# Patient Record
Sex: Female | Born: 1980 | Race: White | Hispanic: No | Marital: Married | Smoking: Never smoker
Health system: Southern US, Community
[De-identification: ages and names within clinical notes are randomized; demographics above are authoritative.]

## PROBLEM LIST (undated history)

## (undated) DIAGNOSIS — F419 Anxiety disorder, unspecified: Secondary | ICD-10-CM

## (undated) DIAGNOSIS — M549 Dorsalgia, unspecified: Secondary | ICD-10-CM

## (undated) DIAGNOSIS — F32A Depression, unspecified: Secondary | ICD-10-CM

## (undated) DIAGNOSIS — T8859XA Other complications of anesthesia, initial encounter: Secondary | ICD-10-CM

## (undated) DIAGNOSIS — F329 Major depressive disorder, single episode, unspecified: Secondary | ICD-10-CM

## (undated) DIAGNOSIS — J45909 Unspecified asthma, uncomplicated: Secondary | ICD-10-CM

## (undated) DIAGNOSIS — R112 Nausea with vomiting, unspecified: Secondary | ICD-10-CM

## (undated) DIAGNOSIS — Z9889 Other specified postprocedural states: Secondary | ICD-10-CM

## (undated) DIAGNOSIS — T4145XA Adverse effect of unspecified anesthetic, initial encounter: Secondary | ICD-10-CM

## (undated) HISTORY — PX: WISDOM TOOTH EXTRACTION: SHX21

## (undated) HISTORY — DX: Anxiety disorder, unspecified: F41.9

---

## 2006-12-09 ENCOUNTER — Ambulatory Visit: Payer: Self-pay

## 2007-02-05 ENCOUNTER — Other Ambulatory Visit: Payer: Self-pay | Admitting: Emergency Medicine

## 2007-02-05 ENCOUNTER — Inpatient Hospital Stay (HOSPITAL_COMMUNITY): Admission: AD | Admit: 2007-02-05 | Discharge: 2007-02-05 | Payer: Self-pay | Admitting: Obstetrics and Gynecology

## 2007-06-08 ENCOUNTER — Inpatient Hospital Stay: Payer: Self-pay

## 2008-09-15 ENCOUNTER — Emergency Department (HOSPITAL_COMMUNITY): Admission: EM | Admit: 2008-09-15 | Discharge: 2008-09-15 | Payer: Self-pay | Admitting: Emergency Medicine

## 2008-12-24 ENCOUNTER — Inpatient Hospital Stay: Payer: Self-pay

## 2011-04-12 LAB — URINALYSIS, ROUTINE W REFLEX MICROSCOPIC
Bilirubin Urine: NEGATIVE
Glucose, UA: NEGATIVE
Ketones, ur: NEGATIVE
Specific Gravity, Urine: 1.018
pH: 7.5

## 2011-04-12 LAB — COMPREHENSIVE METABOLIC PANEL
ALT: 14
AST: 20
Alkaline Phosphatase: 55
Calcium: 9.1
GFR calc Af Amer: >60
Glucose, Bld: 72
Potassium: 4.1
Sodium: 134 — ABNORMAL LOW
Total Protein: 6.8

## 2011-04-12 LAB — DIFFERENTIAL
Basophils Relative: 1
Eosinophils Absolute: 0.1
Eosinophils Relative: 1
Lymphs Abs: 1.3
Monocytes Absolute: 0.6
Monocytes Relative: 7
Neutrophils Relative %: 76

## 2011-04-12 LAB — WET PREP, GENITAL
Clue Cells Wet Prep HPF POC: NONE SEEN
Trich, Wet Prep: NONE SEEN

## 2011-04-12 LAB — CBC
Hemoglobin: 10.7 — ABNORMAL LOW
RBC: 3.35 — ABNORMAL LOW
RDW: 13.2

## 2014-06-26 ENCOUNTER — Ambulatory Visit: Payer: Self-pay | Admitting: Medical

## 2014-12-17 ENCOUNTER — Encounter (HOSPITAL_COMMUNITY): Payer: Self-pay | Admitting: Emergency Medicine

## 2014-12-17 ENCOUNTER — Emergency Department (HOSPITAL_COMMUNITY)
Admission: EM | Admit: 2014-12-17 | Discharge: 2014-12-17 | Disposition: A | Discharge status: Home / Self Care | Payer: BLUE CROSS/BLUE SHIELD | Attending: Emergency Medicine | Admitting: Emergency Medicine

## 2014-12-17 DIAGNOSIS — Y92410 Unspecified street and highway as the place of occurrence of the external cause: Secondary | ICD-10-CM | POA: Diagnosis not present

## 2014-12-17 DIAGNOSIS — S299XXA Unspecified injury of thorax, initial encounter: Secondary | ICD-10-CM | POA: Diagnosis present

## 2014-12-17 DIAGNOSIS — Y9389 Activity, other specified: Secondary | ICD-10-CM | POA: Insufficient documentation

## 2014-12-17 DIAGNOSIS — Y999 Unspecified external cause status: Secondary | ICD-10-CM | POA: Diagnosis not present

## 2014-12-17 DIAGNOSIS — S4991XA Unspecified injury of right shoulder and upper arm, initial encounter: Secondary | ICD-10-CM | POA: Diagnosis not present

## 2014-12-17 DIAGNOSIS — M546 Pain in thoracic spine: Secondary | ICD-10-CM

## 2014-12-17 MED ORDER — IBUPROFEN 400 MG PO TABS
800.0000 mg | ORAL_TABLET | Freq: Once | ORAL | Status: AC
  Administered 2014-12-17: 800 mg via ORAL
  Filled 2014-12-17: qty 2

## 2014-12-17 MED ORDER — CYCLOBENZAPRINE HCL 10 MG PO TABS: 10.0000 mg | ORAL_TABLET | Freq: Three times a day (TID) | ORAL | Status: DC | PRN

## 2014-12-17 MED ORDER — IBUPROFEN 800 MG PO TABS: 800.0000 mg | ORAL_TABLET | Freq: Three times a day (TID) | ORAL | Status: DC | PRN
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
## 2014-12-25 ENCOUNTER — Ambulatory Visit
Admission: RE | Admit: 2014-12-25 | Discharge: 2014-12-25 | Disposition: A | Discharge status: Home / Self Care | Payer: No Typology Code available for payment source | Source: Ambulatory Visit | Attending: Family Medicine | Admitting: Family Medicine

## 2014-12-25 ENCOUNTER — Other Ambulatory Visit: Payer: Self-pay | Admitting: Family Medicine

## 2014-12-25 DIAGNOSIS — M542 Cervicalgia: Secondary | ICD-10-CM

## 2014-12-25 DIAGNOSIS — R52 Pain, unspecified: Secondary | ICD-10-CM

## 2014-12-25 DIAGNOSIS — M25511 Pain in right shoulder: Secondary | ICD-10-CM

## 2015-01-13 ENCOUNTER — Ambulatory Visit: Payer: BLUE CROSS/BLUE SHIELD | Attending: Medical | Admitting: Physical Therapy

## 2015-01-13 DIAGNOSIS — M545 Low back pain, unspecified: Secondary | ICD-10-CM

## 2015-01-13 DIAGNOSIS — M542 Cervicalgia: Secondary | ICD-10-CM | POA: Diagnosis present

## 2015-01-13 DIAGNOSIS — M25511 Pain in right shoulder: Secondary | ICD-10-CM | POA: Diagnosis present
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
## 2015-01-13 NOTE — Patient Instructions (Signed)
All exercises provided were adapted from [email or web address]. Patient was provided a written handout with pictures as described. Any additional cues were manually entered in to handout and copied in to this document.    BRACE HEEL SLIDES  While lying on your back with your knees bent,  slowly slide your heel foward on the floor/bed and then slide it back. Use your stomach muscles to keep your spine from moving.    Have your hand on the notch above your right hip with a tennis ball and a weight of some sort. Perform the exercise without the muscle under the tennis ball popping up.    ELASTIC BAND EXTERNAL ROTATION - BILATERAL   While holding an elastic band with both hands and elbows bent at your side, pull the band away from your stomach.   Keep your elbows at your side the entire time.    Cervical Retraction + OP  Sit upright in a supportive chair and tuck your chin backwards. Add overpressure until you get to the end of movement.

## 2015-01-15 ENCOUNTER — Encounter: Payer: Self-pay | Admitting: Physical Therapy

## 2015-01-20 ENCOUNTER — Ambulatory Visit: Payer: BLUE CROSS/BLUE SHIELD | Admitting: Physical Therapy

## 2015-01-20 DIAGNOSIS — M25511 Pain in right shoulder: Secondary | ICD-10-CM

## 2015-01-20 DIAGNOSIS — M545 Low back pain, unspecified: Secondary | ICD-10-CM

## 2015-01-20 DIAGNOSIS — M542 Cervicalgia: Secondary | ICD-10-CM
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
## 2015-01-23 ENCOUNTER — Encounter: Payer: Self-pay | Admitting: Physical Therapy

## 2015-01-24 ENCOUNTER — Ambulatory Visit: Payer: BLUE CROSS/BLUE SHIELD | Admitting: Physical Therapy

## 2015-01-24 DIAGNOSIS — M25511 Pain in right shoulder: Secondary | ICD-10-CM

## 2015-01-24 DIAGNOSIS — M542 Cervicalgia: Secondary | ICD-10-CM

## 2015-01-24 DIAGNOSIS — M545 Low back pain: Secondary | ICD-10-CM | POA: Diagnosis not present

## 2015-01-24 NOTE — Therapy (Signed)
[place] [facility] [address] [place], [address] Phone: [phone]   Fax:  [phone]  Physical Therapy Treatment  Patient Details  Name: Jasmin Henry MRN: [id number] Date of Birth: Nov 14, 1980 Referring Provider:  Pleas Koch*  Encounter Date: 01/24/2015      PT End of Session - 01/24/15 0824    Visit Number 3   Number of Visits 13   Date for PT Re-Evaluation 03/10/15   PT Start Time 0730   PT Stop Time 0815   PT Time Calculation (min) 45 min   Activity Tolerance Patient tolerated treatment well;No increased pain   Behavior During Therapy [facility] for tasks assessed/performed      No past medical history on file.  No past surgical history on file.  There were no vitals filed for this visit.  Visit Diagnosis:  Right shoulder pain  Cervicalgia      Subjective Assessment - 01/24/15 0821    Subjective Patient reports that she has continued neck stifness when turning to the right and right shoulder pain mainly.   Limitations Lifting   Patient Stated Goals Patient would like to be able to pick up her children without pain and carrying her computer bag across campus ~ 20 minutes.    Currently in Pain? Yes   Pain Location Shoulder   Pain Orientation Right   Pain Descriptors / Indicators Aching   Pain Type Chronic pain   Pain Onset More than a month ago   Pain Frequency Intermittent   Multiple Pain Sites No         Treatment: PA joint mobs over lumbar/thoracic spine, grade II x 3 bouts.  Scapular mobilization on right.  Shoulder joint mobs in PA, AP, distraction grade II x5 bouts.  Manual therapy to right lateral thoracic area where pt. Has muscle knots,  Manual therapy to right shoulder where muscle knots are palpated.  Right side rhomboid manual therapy for muscle tightness and knots.  Right upper trap manual therapy, muscle tightness release.           PT Education -  01/24/15 4401    Education provided Yes   Education Details instructed to continue with HEP, stretching. Discussed possibly trying tDN next session on right shoulder muscle knots.    Person(s) Educated Patient   Methods Explanation   Comprehension Verbalized understanding             PT Long Term Goals - 01/13/15 0939    PT LONG TERM GOAL #1   Title Patient will be independent with HEP to decrease pain and increase functional tolerance of UEs by 03/10/2015.   Status New   PT LONG TERM GOAL #2   Title Patient will report a modified ODI of less than 19% to demonstrate increased activity tolerance by 03/10/2015.    Baseline 28%   Status New   PT LONG TERM GOAL #3   Title Patient will report an NDI score of less than 27% to demonstrate increased activity tolerance by 03/10/2015.   Baseline 36%   PT LONG TERM GOAL #4   Title Patient will report a QuckDash score of less than 23% to demonstrate increased UE functional activity tolerance by 03/10/2015.    Baseline 31.8%   Status New   PT LONG TERM GOAL #5   Title Patient will be able to participate in her recreational activities (running, Pilates, Yoga) with less than 2/10 increase in VAS pain score  to demonstrate return to prior level of activity by 03/10/2015.   Baseline Unable to jog or participate in yoga/pilates without significant increase in pain.    Status New               Plan - 01/24/15 1610    Clinical Impression Statement Patient is continuing to have right shoulder pain and neck stiffness. Pt has made good progress so far with manual therapy, stretching.    Pt will benefit from skilled therapeutic intervention in order to improve on the following deficits Pain;Decreased activity tolerance;Impaired UE functional use   Rehab Potential Good   PT Frequency 1x / week   PT Duration 6 weeks   PT Treatment/Interventions Cryotherapy;Manual techniques;Therapeutic activities;Therapeutic exercise;Taping;Dry needling   PT Home  Exercise Plan See patient instructions.    Consulted and Agree with Plan of Care Patient        Problem List There are no active problems to display for this patient.   Toryn Dewalt, PT , MPT, GCS 01/24/2015, 8:26 AM  [place] [facility] [address], [place], [address] Phone: [phone]   Fax:  [phone]

## 2015-01-27 ENCOUNTER — Ambulatory Visit: Payer: BLUE CROSS/BLUE SHIELD | Attending: Medical | Admitting: Physical Therapy

## 2015-01-27 DIAGNOSIS — M545 Low back pain: Secondary | ICD-10-CM | POA: Diagnosis present

## 2015-01-27 DIAGNOSIS — M542 Cervicalgia: Secondary | ICD-10-CM | POA: Diagnosis present

## 2015-01-27 DIAGNOSIS — M25511 Pain in right shoulder: Secondary | ICD-10-CM | POA: Insufficient documentation

## 2015-01-27 NOTE — Therapy (Signed)
[place] [facility] [address] [place], [address] Phone: [phone]   Fax:  [phone]  Physical Therapy Treatment  Patient Details  Name: Jasmin Henry MRN: [id number] Date of Birth: [birth year] Referring Provider:  Pleas Koch*  Encounter Date: 01/27/2015      PT End of Session - 01/27/15 1228    Visit Number 4   Number of Visits 13   Date for PT Re-Evaluation 03/10/15   PT Start Time 1115   PT Stop Time 1200   PT Time Calculation (min) 45 min   Activity Tolerance No increased pain;Patient limited by pain   Behavior During Therapy [facility] for tasks assessed/performed      No past medical history on file.  No past surgical history on file.  There were no vitals filed for this visit.  Visit Diagnosis:  Right shoulder pain  Cervicalgia      Subjective Assessment - 01/27/15 1206    Subjective Patient reports she may have slept wrong last night. Her shoulder is very achy and medial to her right scapula is very sore.    Limitations House hold activities   Patient Stated Goals Patient would like to be able to pick up her children without pain and carrying her computer bag across campus ~ 20 minutes.    Currently in Pain? Yes   Pain Score 2    Pain Location Shoulder   Pain Orientation Right   Pain Descriptors / Indicators Aching;Tender   Pain Type Chronic pain   Pain Onset More than a month ago   Pain Frequency Intermittent   Multiple Pain Sites No       Treatment: STM to thoracic musculature, rhomboids, upper trap, shoulder musculature. Joint mobs to thoracic and cervical spine PA grade II x 5 bouts x 2 reps.Right Scapular mobilization. Joint mobs to right shoulder PA, AP, distally, posteriorly and joint distraction grade II x 5 bouts.  tDN to right anterior deltoid muscle for muscle tension release. x2            PT Education - 01/27/15 1226    Education provided Yes   Education Details encouraged pt to continue stretching, using tennis ball for tender muscle areas. Adding nerve glide to HEP.   Person(s) Educated Patient   Methods Explanation;Demonstration   Comprehension Verbalized understanding;Returned demonstration             PT Long Term Goals - 01/13/15 0939    PT LONG TERM GOAL #1   Title Patient will be independent with HEP to decrease pain and increase functional tolerance of UEs by 03/10/2015.   Status New   PT LONG TERM GOAL #2   Title Patient will report a modified ODI of less than 19% to demonstrate increased activity tolerance by 03/10/2015.    Baseline 28%   Status New   PT LONG TERM GOAL #3   Title Patient will report an NDI score of less than 27% to demonstrate increased activity tolerance by 03/10/2015.   Baseline 36%   PT LONG TERM GOAL #4   Title Patient will report a QuckDash score of less than 23% to demonstrate increased UE functional activity tolerance by 03/10/2015.    Baseline 31.8%   Status New   PT LONG TERM GOAL #5   Title Patient will be able to participate in her recreational activities (running, Pilates, Yoga) with less than 2/10 increase in VAS pain score to demonstrate return to  prior level of activity by 03/10/2015.   Baseline Unable to jog or participate in yoga/pilates without significant increase in pain.    Status New               Plan - 01/27/15 1228    Clinical Impression Statement Patient continues to have right shoulder and scapular pain. Pt had made good progress, but today reports increased pain.    Pt will benefit from skilled therapeutic intervention in order to improve on the following deficits Pain;Decreased activity tolerance;Impaired UE functional use   Rehab Potential Good   PT Frequency 1x / week   PT Duration 6 weeks   PT Treatment/Interventions Cryotherapy;Manual techniques;Therapeutic activities;Therapeutic exercise;Taping;Dry needling   PT Next Visit Plan Re-assess HEP, progress  UE strengthening (periscapulars, serratus punches). Re-assess psoas tightness and engagement with heel slides.    PT Home Exercise Plan See patient instructions.    Consulted and Agree with Plan of Care Patient        Problem List There are no active problems to display for this patient.   Kentrel Clevenger, PT, MPT, GCS 01/27/2015, 12:31 PM  [place] [facility] [address] [place], [address] Phone: [phone]   Fax:  [phone]

## 2015-01-30 ENCOUNTER — Encounter: Payer: Self-pay | Admitting: Physical Therapy

## 2015-01-31 ENCOUNTER — Ambulatory Visit: Payer: BLUE CROSS/BLUE SHIELD | Admitting: Physical Therapy

## 2015-01-31 DIAGNOSIS — M545 Low back pain, unspecified: Secondary | ICD-10-CM

## 2015-01-31 DIAGNOSIS — M25511 Pain in right shoulder: Secondary | ICD-10-CM

## 2015-01-31 DIAGNOSIS — M542 Cervicalgia: Secondary | ICD-10-CM
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
## 2015-02-03 ENCOUNTER — Ambulatory Visit: Payer: BLUE CROSS/BLUE SHIELD | Admitting: Physical Therapy

## 2015-02-03 DIAGNOSIS — M542 Cervicalgia: Secondary | ICD-10-CM

## 2015-02-03 DIAGNOSIS — M25511 Pain in right shoulder: Secondary | ICD-10-CM | POA: Diagnosis not present

## 2015-02-03 NOTE — Therapy (Signed)
[place] [facility] [address], [place], [address] Phone: [phone]   Fax:  [phone]  Physical Therapy Treatment  Patient Details  Name: Jasmin Henry MRN: [id number] Date of Birth: 09-15-1980 Referring Provider:  Pleas Koch*  Encounter Date: 02/03/2015      PT End of Session - 02/03/15 1121    Visit Number 6   Number of Visits 12   Date for PT Re-Evaluation 03/10/15   PT Start Time 1030   PT Stop Time 1115   PT Time Calculation (min) 45 min   Activity Tolerance Patient tolerated treatment well;No increased pain   Behavior During Therapy [facility] for tasks assessed/performed      No past medical history on file.  No past surgical history on file.  There were no vitals filed for this visit.  Visit Diagnosis:  Right shoulder pain  Cervicalgia      Subjective Assessment - 02/03/15 1113    Subjective Patient reports that her right shoulder/upper back is still painful but has much improved in the last week.    Limitations House hold activities   Patient Stated Goals Patient would like to be able to pick up her children without pain and carrying her computer bag across campus ~ 20 minutes.    Currently in Pain? Yes   Pain Score 2    Pain Location Shoulder   Pain Orientation Right   Pain Descriptors / Indicators Aching;Tender;Tightness   Pain Type Chronic pain   Pain Onset More than a month ago   Pain Frequency Intermittent   Multiple Pain Sites No   Pain Onset More than a month ago   Pain Frequency Constant        Treatment to include:  STM to right cervical, UT, shoulder girdle musculature ( teres, rhomboids) PA joint mobs over entire spine grade II x5 bouts, Rotatonal joint mobs over cervical spine x 3 bouts, grade II.  tDN to upper trap x 2 for muscle knot/pain. Shoulder PA joint mobs and distal joint mobs grade II x 3 bouts Scapular mobilization.          PT Education  - 02/03/15 1120    Education provided Yes   Education Details to continue with yoga, demonstrated neck rotation stretch with overpressure   Person(s) Educated Patient   Methods Explanation   Comprehension Verbalized understanding             PT Long Term Goals - 01/13/15 6962    PT LONG TERM GOAL #1   Title Patient will be independent with HEP to decrease pain and increase functional tolerance of UEs by 03/10/2015.   Status New   PT LONG TERM GOAL #2   Title Patient will report a modified ODI of less than 19% to demonstrate increased activity tolerance by 03/10/2015.    Baseline 28%   Status New   PT LONG TERM GOAL #3   Title Patient will report an NDI score of less than 27% to demonstrate increased activity tolerance by 03/10/2015.   Baseline 36%   PT LONG TERM GOAL #4   Title Patient will report a QuckDash score of less than 23% to demonstrate increased UE functional activity tolerance by 03/10/2015.    Baseline 31.8%   Status New   PT LONG TERM GOAL #5   Title Patient will be able to participate in her recreational activities (running, Pilates, Yoga) with less than 2/10 increase in VAS pain  score to demonstrate return to prior level of activity by 03/10/2015.   Baseline Unable to jog or participate in yoga/pilates without significant increase in pain.    Status New               Plan - 02/03/15 1121    Clinical Impression Statement Patient reports much improved symptoms since last week. She continues to have right neck/UT pain with turning head to right. Making good progress toward goals.    Pt will benefit from skilled therapeutic intervention in order to improve on the following deficits Pain;Decreased activity tolerance;Impaired UE functional use   Rehab Potential Good   PT Frequency 2x / week   PT Duration 6 weeks   PT Treatment/Interventions Cryotherapy;Manual techniques;Therapeutic activities;Therapeutic exercise;Taping;Dry needling   PT Next Visit Plan Re-assess  HEP, progress UE strengthening (periscapulars, serratus punches). Re-assess psoas tightness and engagement with heel slides.    PT Home Exercise Plan See patient instructions.    Consulted and Agree with Plan of Care Patient        Problem List There are no active problems to display for this patient.   Iosefa Weintraub, PT, MPT, GCS 02/03/2015, 11:23 AM  [place] [facility] [address] [place], [address] Phone: [phone]   Fax:  [phone]

## 2015-02-07 ENCOUNTER — Ambulatory Visit: Payer: BLUE CROSS/BLUE SHIELD | Admitting: Physical Therapy

## 2015-02-07 DIAGNOSIS — M25511 Pain in right shoulder: Secondary | ICD-10-CM

## 2015-02-07 NOTE — Therapy (Signed)
[place] [facility] [address], [place], [address] Phone: [phone]   Fax:  [phone]  Physical Therapy Treatment  Patient Details  Name: Jasmin Henry MRN: [id number] Date of Birth: 02/12/1981 Referring Provider:  Pleas Koch*  Encounter Date: 02/07/2015      PT End of Session - 02/07/15 0835    Visit Number 7   Number of Visits 12   Date for PT Re-Evaluation 03/10/15   PT Start Time 0730   PT Stop Time 0815   PT Time Calculation (min) 45 min   Activity Tolerance Patient tolerated treatment well;No increased pain   Behavior During Therapy [facility] for tasks assessed/performed      No past medical history on file.  No past surgical history on file.  There were no vitals filed for this visit.  Visit Diagnosis:  Right shoulder pain      Subjective Assessment - 02/07/15 0833    Subjective Patient reports she woke up yesterday with a very sore tender right shoulder. Her neck, she reports is feeling better.    Limitations Lifting   Patient Stated Goals Patient would like to be able to pick up her children without pain and carrying her computer bag across campus ~ 20 minutes.    Currently in Pain? Yes   Pain Location Shoulder   Pain Orientation Left;Right   Pain Descriptors / Indicators Tender;Sore   Pain Type Chronic pain   Pain Onset More than a month ago   Pain Frequency Intermittent   Multiple Pain Sites No        Treatment to include: STM to right shoulder, scapular region and thoracic musculature.  Joint mobs to left shoulder and thoracic spine in PA grade II x 2 bouts Left shoulder distraction, posterior joint mobs x 5, distal joint mobs x 5 grade II.  UT STM and trigger point release on right.  Passive stretching of R shoulder.             PT Education - 02/07/15 0835    Education provided Yes   Education Details stretching, use heat,    Person(s) Educated Patient    Methods Explanation   Comprehension Verbalized understanding             PT Long Term Goals - 01/13/15 0939    PT LONG TERM GOAL #1   Title Patient will be independent with HEP to decrease pain and increase functional tolerance of UEs by 03/10/2015.   Status New   PT LONG TERM GOAL #2   Title Patient will report a modified ODI of less than 19% to demonstrate increased activity tolerance by 03/10/2015.    Baseline 28%   Status New   PT LONG TERM GOAL #3   Title Patient will report an NDI score of less than 27% to demonstrate increased activity tolerance by 03/10/2015.   Baseline 36%   PT LONG TERM GOAL #4   Title Patient will report a QuckDash score of less than 23% to demonstrate increased UE functional activity tolerance by 03/10/2015.    Baseline 31.8%   Status New   PT LONG TERM GOAL #5   Title Patient will be able to participate in her recreational activities (running, Pilates, Yoga) with less than 2/10 increase in VAS pain score to demonstrate return to prior level of activity by 03/10/2015.   Baseline Unable to jog or participate in yoga/pilates without significant increase in pain.  Status New               Plan - 02/07/15 1610    Clinical Impression Statement Patient has improved since initial eval and reports intermittent pain   Pt will benefit from skilled therapeutic intervention in order to improve on the following deficits Pain;Decreased activity tolerance;Impaired UE functional use   Rehab Potential Good   PT Frequency 2x / week   PT Duration 6 weeks   PT Treatment/Interventions Cryotherapy;Manual techniques;Therapeutic activities;Therapeutic exercise;Taping;Dry needling   PT Next Visit Plan Re-assess HEP, progress UE strengthening (periscapulars, serratus punches). Re-assess psoas tightness and engagement with heel slides.    Consulted and Agree with Plan of Care Patient        Problem List There are no active problems to display for this  patient.   Aime Meloche, PT, MPT 02/07/2015, 8:39 AM  [place] [facility] PHYSICAL AND SPORTS MEDICINE [address] [place], [address] Phone: [phone]   Fax:  [phone]

## 2015-02-10 ENCOUNTER — Ambulatory Visit: Payer: BLUE CROSS/BLUE SHIELD | Admitting: Physical Therapy

## 2015-02-10 DIAGNOSIS — M25511 Pain in right shoulder: Secondary | ICD-10-CM

## 2015-02-10 DIAGNOSIS — M542 Cervicalgia: Secondary | ICD-10-CM

## 2015-02-10 NOTE — Therapy (Signed)
[place] [facility] [address] [place], [address] Phone: [phone]   Fax:  [phone]  Physical Therapy Treatment  Patient Details  Name: Jasmin Henry MRN: [id number] Date of Birth: 1981-02-16 Referring Provider:  Pleas Koch*  Encounter Date: 02/10/2015      PT End of Session - 02/10/15 0843    Visit Number 8   Number of Visits 12   Date for PT Re-Evaluation 03/10/15   PT Start Time 0803   PT Stop Time 0843   PT Time Calculation (min) 40 min   Activity Tolerance Patient tolerated treatment well;No increased pain   Behavior During Therapy [facility] for tasks assessed/performed      No past medical history on file.  No past surgical history on file.  There were no vitals filed for this visit.  Visit Diagnosis:  Cervicalgia  Right shoulder pain      Subjective Assessment - 02/10/15 0841    Subjective Patient reports improved symptoms this session. Slight amount of discomfort and end range of right head turn. Shoulder is feeling better.    Limitations Lifting   Patient Stated Goals Patient would like to be able to pick up her children without pain and carrying her computer bag across campus ~ 20 minutes.    Currently in Pain? No/denies         Treatment: STM to upper trap, rhomboids on right, teres, and deltoid muscles Scapular mobilization, shoulder mobs in PA and distally grade II x 5 reps. Cervical and thoracic joint mobs PA grade II x 5 bouts.  THerex: yellow TB for shoulder retraction, shoulder extension, shoulder flexion, abduction, D1 pattern, shoulder horizontal abd and IR/ER X 10 reps each.             PT Education - 02/10/15 [phone]    Education provided Yes   Education Details Yellow TB exercises.    Person(s) Educated Patient   Methods Explanation;Demonstration   Comprehension Verbalized understanding;Returned demonstration             PT Long Term Goals  - 01/13/15 0939    PT LONG TERM GOAL #1   Title Patient will be independent with HEP to decrease pain and increase functional tolerance of UEs by 03/10/2015.   Status New   PT LONG TERM GOAL #2   Title Patient will report a modified ODI of less than 19% to demonstrate increased activity tolerance by 03/10/2015.    Baseline 28%   Status New   PT LONG TERM GOAL #3   Title Patient will report an NDI score of less than 27% to demonstrate increased activity tolerance by 03/10/2015.   Baseline 36%   PT LONG TERM GOAL #4   Title Patient will report a QuckDash score of less than 23% to demonstrate increased UE functional activity tolerance by 03/10/2015.    Baseline 31.8%   Status New   PT LONG TERM GOAL #5   Title Patient will be able to participate in her recreational activities (running, Pilates, Yoga) with less than 2/10 increase in VAS pain score to demonstrate return to prior level of activity by 03/10/2015.   Baseline Unable to jog or participate in yoga/pilates without significant increase in pain.    Status New               Plan - 02/10/15 9528    Clinical Impression Statement Patient has imrproved greatly since initial eval. Minimal pain at  end rang eof right neck turning. Shoulder has improved.     Pt will benefit from skilled therapeutic intervention in order to improve on the following deficits Pain;Decreased activity tolerance;Impaired UE functional use   Rehab Potential Good   PT Frequency 2x / week   PT Duration 6 weeks   PT Treatment/Interventions Cryotherapy;Manual techniques;Therapeutic activities;Therapeutic exercise;Taping;Dry needling   Consulted and Agree with Plan of Care Patient        Problem List There are no active problems to display for this patient.   Jarrette Dehner, PT, MPT 02/10/2015, 8:46 AM  [place] [facility] PHYSICAL AND SPORTS MEDICINE [address] [place], [address] Phone: [phone]   Fax:   [phone]

## 2015-02-17 ENCOUNTER — Ambulatory Visit: Payer: BLUE CROSS/BLUE SHIELD | Admitting: Physical Therapy

## 2015-02-17 DIAGNOSIS — M542 Cervicalgia: Secondary | ICD-10-CM

## 2015-02-17 DIAGNOSIS — M25511 Pain in right shoulder: Secondary | ICD-10-CM

## 2015-02-17 DIAGNOSIS — M545 Low back pain, unspecified: Secondary | ICD-10-CM

## 2015-02-17 NOTE — Therapy (Signed)
[facility] PHYS[facility] SPORTS MEDICINE [address] [place], [address] Phone: [phone]   Fax:  [phone]  Physical Therapy Treatment  Patient Details  Name: Jasmin Henry MRN: [id number] Date of Birth: 1981-03-30 Referring Provider:  Pleas Koch*  Encounter Date: 02/17/2015      PT End of Session - 02/17/15 1245    Visit Number 9   Number of Visits 12   Date for PT Re-Evaluation 03/10/15   PT Start Time 1152   PT Stop Time 1235   PT Time Calculation (min) 43 min   Activity Tolerance Patient tolerated treatment well;No increased pain   Behavior During Therapy [facility] for tasks assessed/performed      No past medical history on file.  No past surgical history on file.  There were no vitals filed for this visit.  Visit Diagnosis:  Cervicalgia  Right shoulder pain  Midline low back pain without sciatica      Subjective Assessment - 02/17/15 1243    Subjective Patient reports improvement in symptoms and pain. She continues to do yoga, stretching, and began stretngthening. Slight right shoulder pain and neck pain reported.    Limitations Lifting;Other (comment)  driving   Patient Stated Goals Patient would like to be able to pick up her children without pain and carrying her computer bag across campus ~ 20 minutes.    Currently in Pain? Yes   Pain Score 2    Pain Location Neck   Pain Descriptors / Indicators Tender;Sore   Pain Type Chronic pain   Pain Onset More than a month ago   Pain Frequency Intermittent   Multiple Pain Sites No           Treatment: STM to bilateral upper trap,bilateral rhomboids, teres, and deltoid muscles on right Scapular mobilization, shoulder mobs in PA and distally grade II x 5 reps. Cervical and thoracic joint mobs PA grade II x 5 bouts.  THerex: yellow TB for shoulder retraction, shoulder extension, shoulder flexion, abduction, D1 pattern, shoulder horizontal abd and  IR/ER X 10 reps each.                              PT Education - 02/17/15 1244    Education provided Yes   Education Details reviewed exercises, added rows with yellow band.   Person(s) Educated Patient   Methods Explanation;Demonstration   Comprehension Verbalized understanding;Returned demonstration             PT Long Term Goals - 01/13/15 0939    PT LONG TERM GOAL #1   Title Patient will be independent with HEP to decrease pain and increase functional tolerance of UEs by 03/10/2015.   Status New   PT LONG TERM GOAL #2   Title Patient will report a modified ODI of less than 19% to demonstrate increased activity tolerance by 03/10/2015.    Baseline 28%   Status New   PT LONG TERM GOAL #3   Title Patient will report an NDI score of less than 27% to demonstrate increased activity tolerance by 03/10/2015.   Baseline 36%   PT LONG TERM GOAL #4   Title Patient will report a QuckDash score of less than 23% to demonstrate increased UE functional activity tolerance by 03/10/2015.    Baseline 31.8%   Status New   PT LONG TERM GOAL #5   Title Patient will be able to participate  in her recreational activities (running, Pilates, Yoga) with less than 2/10 increase in VAS pain score to demonstrate return to prior level of activity by 03/10/2015.   Baseline Unable to jog or participate in yoga/pilates without significant increase in pain.    Status New               Plan - 02/17/15 1245    Clinical Impression Statement Patient is improving, continues to have mild pain. Will reduce sessions to 1x per week for the next 2 weeks.    Pt will benefit from skilled therapeutic intervention in order to improve on the following deficits Pain;Decreased activity tolerance;Impaired UE functional use   Rehab Potential Good   PT Frequency 2x / week   PT Duration 6 weeks   PT Treatment/Interventions Cryotherapy;Manual techniques;Therapeutic activities;Therapeutic  exercise;Taping;Dry needling   PT Next Visit Plan Re-assess HEP, progress UE strengthening (periscapulars, serratus punches). Re-assess psoas tightness and engagement with heel slides.    PT Home Exercise Plan See patient instructions.    Consulted and Agree with Plan of Care Patient        Problem List There are no active problems to display for this patient.   Heide Brossart, PT, MPT 02/17/2015, 12:48 PM  [place] [facility] PHYSICAL [facility] SPORTS MEDICINE [address] [place], [address] Phone: [phone]   Fax:  [phone]

## 2015-02-21 ENCOUNTER — Ambulatory Visit: Payer: BLUE CROSS/BLUE SHIELD | Admitting: Physical Therapy

## 2015-02-21 DIAGNOSIS — M25511 Pain in right shoulder: Secondary | ICD-10-CM | POA: Diagnosis not present

## 2015-02-21 DIAGNOSIS — M542 Cervicalgia: Secondary | ICD-10-CM

## 2015-02-21 NOTE — Therapy (Signed)
[facility] [facility] PHYSICAL [facility] SPORTS MEDICINE [address], [place], [address] Phone: [phone]   Fax:  [phone]  Physical Therapy Treatment  Patient Details  Name: Jasmin Henry MRN: [id number] Date of Birth: 02-15-1981 Referring Provider:  Pleas Koch*  Encounter Date: 02/21/2015      PT End of Session - 02/21/15 1252    Visit Number 10   Number of Visits 12   Date for PT Re-Evaluation 03/10/15   PT Start Time 1205   PT Stop Time 1250   PT Time Calculation (min) 45 min   Activity Tolerance Patient tolerated treatment well;No increased pain   Behavior During Therapy [facility] for tasks assessed/performed      No past medical history on file.  No past surgical history on file.  There were no vitals filed for this visit.  Visit Diagnosis:  Cervicalgia      Subjective Assessment - 02/21/15 1250    Subjective Patient reports continued point tender and pain with full right head turning. From UT to rhomboids on the right.     Limitations Lifting;Other (comment)   Patient Stated Goals Patient would like to be able to pick up her children without pain and carrying her computer bag across campus ~ 20 minutes.    Currently in Pain? Yes   Pain Score 2    Pain Location Neck   Pain Orientation Right   Pain Type Chronic pain   Pain Onset More than a month ago   Pain Frequency Intermittent   Multiple Pain Sites No         Treatment: Cervical STM for pain and tightness  Cervical and thoracic joint mobs PA grade II x 5 bouts. Rotational cervical joint mobs grade II-III x 5 bouts THerex: yellow TB for shoulder retraction, shoulder extension, shoulder flexion, abduction, D1 pattern, shoulder horizontal abd and IR/ER X 15 reps each. (IR/ER with red TB) Isometric neck strengthening exercises x 5 reps, manual cervical traction in seated position x 5 with 15 sec hold.  Traction provided relief from pain.             PT Education - 02/21/15 1251    Education provided Yes   Education Details self traction techniques. Isometric neck exercises.    Person(s) Educated Patient   Methods Explanation;Demonstration   Comprehension Verbalized understanding;Returned demonstration             PT Long Term Goals - 01/13/15 0939    PT LONG TERM GOAL #1   Title Patient will be independent with HEP to decrease pain and increase functional tolerance of UEs by 03/10/2015.   Status New   PT LONG TERM GOAL #2   Title Patient will report a modified ODI of less than 19% to demonstrate increased activity tolerance by 03/10/2015.    Baseline 28%   Status New   PT LONG TERM GOAL #3   Title Patient will report an NDI score of less than 27% to demonstrate increased activity tolerance by 03/10/2015.   Baseline 36%   PT LONG TERM GOAL #4   Title Patient will report a QuckDash score of less than 23% to demonstrate increased UE functional activity tolerance by 03/10/2015.    Baseline 31.8%   Status New   PT LONG TERM GOAL #5   Title Patient will be able to participate in her recreational activities (running, Pilates, Yoga) with less than 2/10 increase in VAS pain score to demonstrate return to  prior level of activity by 03/10/2015.   Baseline Unable to jog or participate in yoga/pilates without significant increase in pain.    Status New               Plan - 02/21/15 1252    Clinical Impression Statement Patient has improved substanially since initiating therapy, however continues to have neck pain with right rotation.    Pt will benefit from skilled therapeutic intervention in order to improve on the following deficits Pain;Decreased activity tolerance;Impaired UE functional use   Rehab Potential Good   PT Frequency 2x / week   PT Duration 6 weeks   PT Treatment/Interventions Cryotherapy;Manual techniques;Therapeutic activities;Therapeutic exercise;Taping;Dry needling   PT Next Visit Plan Re-assess HEP, progress  UE strengthening (periscapulars, serratus punches). Re-assess psoas tightness and engagement with heel slides.    Consulted and Agree with Plan of Care Patient        Problem List There are no active problems to display for this patient.   Maud Rubendall, PT, MPT 02/21/2015, 12:59 PM  [place] [facility] PHYSICAL [facility] SPORTS MEDICINE [address] [place], [address] Phone: [phone]   Fax:  [phone]

## 2015-02-24 ENCOUNTER — Ambulatory Visit: Payer: BLUE CROSS/BLUE SHIELD | Admitting: Physical Therapy

## 2015-02-24 DIAGNOSIS — M25511 Pain in right shoulder: Secondary | ICD-10-CM | POA: Diagnosis not present

## 2015-02-24 DIAGNOSIS — M542 Cervicalgia: Secondary | ICD-10-CM

## 2015-02-24 NOTE — Therapy (Signed)
[place] [facility] PHYSICAL [facility] SPORTS MEDICINE [address], [place], [address] Phone: [phone]   Fax:  [phone]  Physical Therapy Treatment  Patient Details  Name: Jasmin Henry MRN: [id number] Date of Birth: 10-Sep-1980 Referring Provider:  Pleas Koch*  Encounter Date: 02/24/2015      PT End of Session - 02/24/15 1715    Visit Number 11   Number of Visits 12   Date for PT Re-Evaluation 03/10/15   PT Start Time 1115   PT Stop Time 1200   PT Time Calculation (min) 45 min   Activity Tolerance Patient tolerated treatment well;No increased pain   Behavior During Therapy [facility] for tasks assessed/performed      No past medical history on file.  No past surgical history on file.  There were no vitals filed for this visit.  Visit Diagnosis:  Cervicalgia      Subjective Assessment - 02/24/15 1709    Subjective patient reports she is feeling better. Has point tender pain in neck with end range of right cervical rotation Also has some right sided rhomboid pain and periscapular pain.   Limitations Lifting;Other (comment)   Patient Stated Goals Patient would like to be able to pick up her children without pain and carrying her computer bag across campus ~ 20 minutes.    Currently in Pain? Yes   Pain Location Neck   Pain Orientation Right   Pain Descriptors / Indicators Sore;Sharp   Pain Type Chronic pain   Pain Onset More than a month ago   Pain Frequency Intermittent   Multiple Pain Sites Yes   Pain Location Back   Pain Orientation Posterior;Mid   Pain Descriptors / Indicators Tender;Sore   Pain Type Chronic pain   Pain Onset More than a month ago   Pain Frequency Intermittent        Treatment: Manual therapy to trigger points in cervical muscles and rhomboids on the right.  PA joint mobs to thoracic spine, grade II x 5 bouts.  Instructed patient with self trigger point release.           PT Education  - 02/24/15 1714    Education provided Yes   Education Details self pressure point release, progression of continued care.    Person(s) Educated Patient   Methods Explanation;Demonstration   Comprehension Verbalized understanding;Returned demonstration             PT Long Term Goals - 01/13/15 0939    PT LONG TERM GOAL #1   Title Patient will be independent with HEP to decrease pain and increase functional tolerance of UEs by 03/10/2015.   Status New   PT LONG TERM GOAL #2   Title Patient will report a modified ODI of less than 19% to demonstrate increased activity tolerance by 03/10/2015.    Baseline 28%   Status New   PT LONG TERM GOAL #3   Title Patient will report an NDI score of less than 27% to demonstrate increased activity tolerance by 03/10/2015.   Baseline 36%   PT LONG TERM GOAL #4   Title Patient will report a QuckDash score of less than 23% to demonstrate increased UE functional activity tolerance by 03/10/2015.    Baseline 31.8%   Status New   PT LONG TERM GOAL #5   Title Patient will be able to participate in her recreational activities (running, Pilates, Yoga) with less than 2/10 increase in VAS pain score to demonstrate return  to prior level of activity by 03/10/2015.   Baseline Unable to jog or participate in yoga/pilates without significant increase in pain.    Status New               Plan - 02/24/15 1715    Clinical Impression Statement Patient has made significant progress since beginning PT. She continues to have point tenderness in 2 places with certain movements.    Pt will benefit from skilled therapeutic intervention in order to improve on the following deficits Pain;Decreased activity tolerance;Impaired UE functional use   Rehab Potential Good   PT Frequency 2x / week   PT Duration 6 weeks   PT Treatment/Interventions Cryotherapy;Manual techniques;Therapeutic activities;Therapeutic exercise;Taping;Dry needling   PT Next Visit Plan Re-assess HEP,  progress UE strengthening (periscapulars, serratus punches). Re-assess psoas tightness and engagement with heel slides.    Consulted and Agree with Plan of Care Patient        Problem List There are no active problems to display for this patient.   Sagal Gayton, PT, MPT 02/24/2015, 5:19 PM  [place] [facility] PHYSICAL [facility] SPORTS MEDICINE [address], [place], [address] Phone: [phone]   Fax:  [phone]

## 2015-03-07 ENCOUNTER — Ambulatory Visit: Payer: BLUE CROSS/BLUE SHIELD | Admitting: Physical Therapy

## 2015-03-14 ENCOUNTER — Ambulatory Visit: Payer: BLUE CROSS/BLUE SHIELD | Attending: Medical | Admitting: Physical Therapy

## 2015-03-14 DIAGNOSIS — M25511 Pain in right shoulder: Secondary | ICD-10-CM

## 2015-03-14 DIAGNOSIS — M542 Cervicalgia: Secondary | ICD-10-CM | POA: Diagnosis not present

## 2015-03-14 NOTE — Therapy (Signed)
[facility] [address] [place], [address] Phone: [phone]   Fax:  [phone]  Physical Therapy Treatment  Patient Details  Name: Jasmin Henry MRN: [id number] Date of Birth: 11-22-1980 Referring Provider:  Marcy Salvo*  Encounter Date: 03/14/2015      PT End of Session - 03/14/15 0844    Visit Number 1   Number of Visits 6   Date for PT Re-Evaluation 04/25/15   PT Start Time 0730   PT Stop Time 0810   PT Time Calculation (min) 40 min   Activity Tolerance Patient tolerated treatment well;No increased pain   Behavior During Therapy [facility] for tasks assessed/performed      No past medical history on file.  No past surgical history on file.  There were no vitals filed for this visit.  Visit Diagnosis:  Cervicalgia - Plan: PT plan of care cert/re-cert  Right shoulder pain - Plan: PT plan of care cert/re-cert      Subjective Assessment - 03/14/15 0840    Subjective Patient reports she made the mistake of cancelling last week and has had a lot of pain this week. Down into her right shoulder.   Limitations Lifting   Patient Stated Goals Patient would like to be able to pick up her children without pain and carrying her computer bag across campus ~ 20 minutes.    Currently in Pain? Yes   Pain Location Shoulder   Pain Orientation Right;Upper   Pain Descriptors / Indicators Sore;Aching   Pain Type Chronic pain   Pain Onset More than a month ago   Pain Frequency Intermittent   Multiple Pain Sites No        Treatment:  STM to upper back, right shoulder and right cervical region for trigger points, pain management.  Joint mobs to right shoulder PA, distally and joint distraction. Grade II x 5 reps.  Scapular mobilization on the right.           PT Education - 03/14/15 [phone]    Education provided Yes   Education Details to continue working on stretching, YOGA etc.    Person(s) Educated Patient   Methods Explanation   Comprehension Verbalized understanding             PT Long Term Goals - 03/14/15 0846    PT LONG TERM GOAL #1   Title Patient will be independent with HEP to decrease pain and increase functional tolerance of UEs by 03/10/2015.   Time 6   Period Weeks   Status Achieved   PT LONG TERM GOAL #2   Title Patient will report a modified ODI of less than 19% to demonstrate increased activity tolerance by 03/10/2015.    Baseline 28%   Time 6   Period Weeks   Status On-going   PT LONG TERM GOAL #3   Title Patient will report an NDI score of less than 27% to demonstrate increased activity tolerance by 03/10/2015.   Baseline 36%   Time 6   Period Weeks   Status On-going   PT LONG TERM GOAL #4   Title Patient will report a QuckDash score of less than 23% to demonstrate increased UE functional activity tolerance by 03/10/2015.    Baseline 31.8%   Time 6   Status On-going   PT LONG TERM GOAL #5   Title Patient will be able to participate in her recreational activities (running, Pilates, Yoga) with less than  2/10 increase in VAS pain score to demonstrate return to prior level of activity by 03/10/2015.   Baseline Unable to jog or participate in yoga/pilates without significant increase in pain.    Time 6   Period Weeks   Status Partially Met               Plan - 03/14/15 0845    Clinical Impression Statement Patient has not been seen for 2 weeks and is having increased pain in right shoulder. Pt will be recertified for 6 more visits.    Pt will benefit from skilled therapeutic intervention in order to improve on the following deficits Pain;Decreased activity tolerance;Impaired UE functional use   Rehab Potential Fair   PT Frequency 1x / week   PT Duration 6 weeks   PT Treatment/Interventions Cryotherapy;Manual techniques;Therapeutic activities;Therapeutic exercise;Taping;Dry needling   Consulted and Agree with Plan of Care Patient         Problem List There are no active problems to display for this patient.   Conetta,Kristyn, PT, MPT 03/14/2015, 10:00 AM  [place] [address], [place], [address] Phone: [phone]   Fax:  [phone]

## 2015-03-21 ENCOUNTER — Ambulatory Visit: Payer: BLUE CROSS/BLUE SHIELD | Admitting: Physical Therapy

## 2015-03-21 DIAGNOSIS — M25511 Pain in right shoulder: Secondary | ICD-10-CM

## 2015-03-21 DIAGNOSIS — M542 Cervicalgia: Secondary | ICD-10-CM | POA: Diagnosis not present

## 2015-03-21 NOTE — Therapy (Signed)
[facility] [address] [place], [address] Phone: [phone]   Fax:  [phone]  Physical Therapy Treatment  Patient Details  Name: Jasmin Henry MRN: [id number] Date of Birth: [birth year] Referring Provider:  Marcy Salvo*  Encounter Date: 03/21/2015      PT End of Session - 03/21/15 0902    Visit Number 2   Number of Visits 6   Date for PT Re-Evaluation 04/25/15   PT Start Time 0815   PT Stop Time 0349   PT Time Calculation (min) 40 min   Activity Tolerance Patient tolerated treatment well;No increased pain   Behavior During Therapy [facility] for tasks assessed/performed      No past medical history on file.  No past surgical history on file.  There were no vitals filed for this visit.  Visit Diagnosis:  Cervicalgia  Right shoulder pain      Subjective Assessment - 03/21/15 0857    Subjective Patient reports that she feels better than last week, but has right shoulder pain today and continued neck pain with turning to the right. Patient reports she went back to the MD who suggested steroid shot in neck. She has not scheduled that yet but is planning to.    Limitations Lifting;House hold activities   Patient Stated Goals Patient would like to be able to pick up her children without pain and carrying her computer bag across campus ~ 20 minutes.    Currently in Pain? Yes   Pain Score 2    Pain Location Shoulder   Pain Orientation Right   Pain Descriptors / Indicators Aching   Pain Type Chronic pain   Pain Onset More than a month ago   Pain Frequency Intermittent   Multiple Pain Sites Yes   Pain Score 2   Pain Location Neck   Pain Orientation Right;Posterior   Pain Descriptors / Indicators Tightness;Tender   Pain Type Chronic pain   Pain Onset More than a month ago   Pain Frequency Intermittent       Treatment:  Trigger point release of levator scapulae with active  mobilizations of left side bending x 10 reps.  TDN to levator scapulae muscle Joint mobs in PA, AP, distraction and distally to right shoulder x 5 bouts of 30 sec each.  Levator scapulae self stretch taught to patient. Asked that she perform this over the weekend. Pt reports good stretch in the correct area with this activity.  Cervical muscle STM on right for pain relief.            PT Education - 03/21/15 0901    Education provided Yes   Education Details levator scapulae stretch, positioning and posture of shoulders and scapula.   Person(s) Educated Patient   Methods Explanation;Demonstration   Comprehension Verbalized understanding;Returned demonstration;Verbal cues required             PT Long Term Goals - 03/14/15 0846    PT LONG TERM GOAL #1   Title Patient will be independent with HEP to decrease pain and increase functional tolerance of UEs by 03/10/2015.   Time 6   Period Weeks   Status Achieved   PT LONG TERM GOAL #2   Title Patient will report a modified ODI of less than 19% to demonstrate increased activity tolerance by 03/10/2015.    Baseline 28%   Time 6   Period Weeks   Status On-going   PT LONG TERM  GOAL #3   Title Patient will report an NDI score of less than 27% to demonstrate increased activity tolerance by 03/10/2015.   Baseline 36%   Time 6   Period Weeks   Status On-going   PT LONG TERM GOAL #4   Title Patient will report a QuckDash score of less than 23% to demonstrate increased UE functional activity tolerance by 03/10/2015.    Baseline 31.8%   Time 6   Status On-going   PT LONG TERM GOAL #5   Title Patient will be able to participate in her recreational activities (running, Pilates, Yoga) with less than 2/10 increase in VAS pain score to demonstrate return to prior level of activity by 03/10/2015.   Baseline Unable to jog or participate in yoga/pilates without significant increase in pain.    Time 6   Period Weeks   Status Partially Met                Plan - 03/21/15 1238    Clinical Impression Statement Patient has been given new stretch to continue with at home this date. She continues to have neck and right shoulder pain. Which she states has improved since last week.     Pt will benefit from skilled therapeutic intervention in order to improve on the following deficits Pain;Decreased activity tolerance;Impaired UE functional use   Rehab Potential Fair   PT Frequency 1x / week   PT Duration 6 weeks   PT Treatment/Interventions Cryotherapy;Manual techniques;Therapeutic activities;Therapeutic exercise;Taping;Dry needling   Consulted and Agree with Plan of Care Patient        Problem List There are no active problems to display for this patient.   Conetta,Kristyn, PT, MPT, GCS 03/21/2015, 12:41 PM  [facility] [address] [place], [address] Phone: [phone]   Fax:  [phone]

## 2015-03-28 ENCOUNTER — Ambulatory Visit: Payer: BLUE CROSS/BLUE SHIELD | Admitting: Physical Therapy

## 2015-03-28 DIAGNOSIS — M542 Cervicalgia: Secondary | ICD-10-CM

## 2015-03-28 NOTE — Therapy (Signed)
[facility] [address] [place], [address] Phone: [phone]   Fax:  [phone]  Physical Therapy Treatment  Patient Details  Name: Jasmin Henry MRN: [id number] Date of Birth: August 29, 1980 Referring Provider:  Marcy Salvo*  Encounter Date: 03/28/2015    No past medical history on file.  No past surgical history on file.  There were no vitals filed for this visit.  Visit Diagnosis:  Cervicalgia      Subjective Assessment - 03/28/15 0908    Subjective Patient reports she is feeling better. Reports she had ergonomic assessment at workstation and that he holds phone between ear and right shoulder constantly throughout day and also looks down when using laptop.    Limitations Lifting;House hold activities   Patient Stated Goals Patient would like to be able to pick up her children without pain and carrying her computer bag across campus ~ 20 minutes.    Currently in Pain? Yes   Pain Location Neck   Pain Orientation Right   Pain Descriptors / Indicators Aching;Sore   Pain Type Chronic pain   Pain Onset More than a month ago   Pain Frequency Intermittent   Multiple Pain Sites No           Treatment:  STM, Trigger point release of levator scapulae muscle, good relief of pain reported.  Cervical and thoracic Joint mobs in PA grade II 2 bouts of 30 seconds,   Cervical muscle STM on right for pain relief.  Scapular mobilization on the right.  Self stretch of thoracic spine taught and performed by patient. With good relief felt in upper back/shoulders.              PT Education - 03/28/15 0912    Education provided Yes   Education Details thoracic stretch, postural awareness, changes to make at work to help prevent aggrivation.    Person(s) Educated Patient   Methods Explanation;Demonstration   Comprehension Verbalized understanding;Returned demonstration              PT Long Term Goals - 03/14/15 0846    PT LONG TERM GOAL #1   Title Patient will be independent with HEP to decrease pain and increase functional tolerance of UEs by 03/10/2015.   Time 6   Period Weeks   Status Achieved   PT LONG TERM GOAL #2   Title Patient will report a modified ODI of less than 19% to demonstrate increased activity tolerance by 03/10/2015.    Baseline 28%   Time 6   Period Weeks   Status On-going   PT LONG TERM GOAL #3   Title Patient will report an NDI score of less than 27% to demonstrate increased activity tolerance by 03/10/2015.   Baseline 36%   Time 6   Period Weeks   Status On-going   PT LONG TERM GOAL #4   Title Patient will report a QuckDash score of less than 23% to demonstrate increased UE functional activity tolerance by 03/10/2015.    Baseline 31.8%   Time 6   Status On-going   PT LONG TERM GOAL #5   Title Patient will be able to participate in her recreational activities (running, Pilates, Yoga) with less than 2/10 increase in VAS pain score to demonstrate return to prior level of activity by 03/10/2015.   Baseline Unable to jog or participate in yoga/pilates without significant increase in pain.    Time 6   Period  Weeks   Status Partially Met               Plan - 03/28/15 0913    Clinical Impression Statement Patient has mild symptoms at this time. Has work station changes to make to prevent aggrivation and postural awareness also to prevent continued flare up.    Pt will benefit from skilled therapeutic intervention in order to improve on the following deficits Pain;Decreased activity tolerance;Impaired UE functional use   Rehab Potential Fair   PT Frequency 1x / week   PT Treatment/Interventions Cryotherapy;Manual techniques;Therapeutic activities;Therapeutic exercise;Taping;Dry needling   PT Home Exercise Plan See patient instructions.    Consulted and Agree with Plan of Care Patient        Problem List There are no active  problems to display for this patient.   Conetta,Kristyn, PT, MPT, GCS 03/28/2015, 9:16 AM  [facility] [address] [place], [address] Phone: [phone]   Fax:  [phone]

## 2015-04-04 ENCOUNTER — Ambulatory Visit: Payer: BLUE CROSS/BLUE SHIELD | Attending: Medical | Admitting: Physical Therapy

## 2015-04-04 DIAGNOSIS — M542 Cervicalgia: Secondary | ICD-10-CM | POA: Diagnosis not present

## 2015-04-04 DIAGNOSIS — M25511 Pain in right shoulder: Secondary | ICD-10-CM
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
## 2015-04-18 ENCOUNTER — Ambulatory Visit: Payer: BLUE CROSS/BLUE SHIELD | Admitting: Physical Therapy

## 2015-04-18 DIAGNOSIS — M542 Cervicalgia: Secondary | ICD-10-CM

## 2015-04-18 DIAGNOSIS — M25511 Pain in right shoulder: Secondary | ICD-10-CM
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
## 2015-04-25 ENCOUNTER — Ambulatory Visit: Payer: BLUE CROSS/BLUE SHIELD | Admitting: Physical Therapy

## 2015-04-25 DIAGNOSIS — M25511 Pain in right shoulder: Secondary | ICD-10-CM

## 2015-04-25 DIAGNOSIS — M542 Cervicalgia: Secondary | ICD-10-CM | POA: Diagnosis not present

## 2015-04-25 NOTE — Therapy (Signed)
[facility] [address], [place], [address] Phone: [phone]   Fax:  [phone]  Physical Therapy Treatment  Patient Details  Name: JOELEE Henry MRN: [id number] Date of Birth: 1980-10-18 No Data Recorded  Encounter Date: 04/25/2015      PT End of Session - 04/25/15 0811    Visit Number 5   Number of Visits 6   Date for PT Re-Evaluation 04/25/15   Activity Tolerance Patient tolerated treatment well;No increased pain   Behavior During Therapy [facility] for tasks assessed/performed      No past medical history on file.  No past surgical history on file.  There were no vitals filed for this visit.  Visit Diagnosis:  Right shoulder pain      Subjective Assessment - 04/25/15 0807    Subjective Patient reports she is feeling better. Continues to have specific areas of localized pain on border of scapula on right medially.    Limitations Lifting;House hold activities   Patient Stated Goals Patient would like to be able to pick up her children without pain and carrying her computer bag across campus ~ 20 minutes.    Currently in Pain? No/denies       Treatment to include: STM to rhomboids and upper traps on right side. Dry Needling to rhomboids on the right          PT Education - 04/25/15 0810    Education provided Yes   Education Details continuing with massage, Possible side effects of Dry Needling.    Person(s) Educated Patient   Methods Explanation   Comprehension Verbalized understanding             PT Long Term Goals - 03/14/15 0846    PT LONG TERM GOAL #1   Title Patient will be independent with HEP to decrease pain and increase functional tolerance of UEs by 03/10/2015.   Time 6   Period Weeks   Status Achieved   PT LONG TERM GOAL #2   Title Patient will report a modified ODI of less than 19% to demonstrate increased activity tolerance by 03/10/2015.    Baseline 28%   Time 6   Period Weeks   Status On-going   PT LONG TERM GOAL #3   Title Patient will report an NDI score of less than 27% to demonstrate increased activity tolerance by 03/10/2015.   Baseline 36%   Time 6   Period Weeks   Status On-going   PT LONG TERM GOAL #4   Title Patient will report a QuckDash score of less than 23% to demonstrate increased UE functional activity tolerance by 03/10/2015.    Baseline 31.8%   Time 6   Status On-going   PT LONG TERM GOAL #5   Title Patient will be able to participate in her recreational activities (running, Pilates, Yoga) with less than 2/10 increase in VAS pain score to demonstrate return to prior level of activity by 03/10/2015.   Baseline Unable to jog or participate in yoga/pilates without significant increase in pain.    Time 6   Period Weeks   Status Partially Met               Plan - 04/25/15 0814    Clinical Impression Statement Patient has made great progress with therapy. Having good response to Dry Needling. Planning to meet for one more visit as of now.    Pt will benefit from skilled therapeutic intervention in  order to improve on the following deficits Pain;Decreased activity tolerance;Impaired UE functional use   Rehab Potential Fair   PT Frequency 1x / week   PT Duration 6 weeks   PT Treatment/Interventions Cryotherapy;Manual techniques;Therapeutic activities;Therapeutic exercise;Taping;Dry needling   Consulted and Agree with Plan of Care Patient        Problem List There are no active problems to display for this patient.   Conetta,Kristyn, PT, MPT, GCS 04/25/2015, 8:19 AM  [facility] [address], [place], [address] Phone: [phone]   Fax:  [phone]  Name: Jasmin Henry MRN: [id number] Date of Birth: 09/12/1980

## 2015-05-09 ENCOUNTER — Ambulatory Visit: Payer: BLUE CROSS/BLUE SHIELD | Attending: Medical | Admitting: Physical Therapy

## 2015-05-09 DIAGNOSIS — M542 Cervicalgia: Secondary | ICD-10-CM | POA: Diagnosis present

## 2015-05-09 NOTE — Therapy (Signed)
[facility] [address] [place], [address] Phone: [phone]   Fax:  [phone]  Physical Therapy Treatment  Patient Details  Name: HOLLIDAY SHEAFFER MRN: [id number] Date of Birth: [birth year] No Data Recorded  Encounter Date: 05/09/2015      PT End of Session - 05/09/15 1106    Visit Number 1   Number of Visits 1   Date for PT Re-Evaluation 05/09/15   PT Start Time 0730   PT Stop Time 0810   PT Time Calculation (min) 40 min   Activity Tolerance Patient tolerated treatment well;No increased pain   Behavior During Therapy [facility] for tasks assessed/performed      No past medical history on file.  No past surgical history on file.  There were no vitals filed for this visit.  Visit Diagnosis:  Cervicalgia - Plan: PT plan of care cert/re-cert      Subjective Assessment - 05/09/15 1104    Subjective Patient reports she is feeling better overall. She has some neck pain when turning her head, but it has improved.    Limitations Lifting;House hold activities   Patient Stated Goals Patient would like to be able to pick up her children without pain and carrying her computer bag across campus ~ 20 minutes.    Currently in Pain? No/denies           Treatment to include: STM to rhomboids, cervical muscles and upper traps on right side. Trigger points palpated and manually worked out.          PT Education - 05/09/15 1105    Education provided Yes   Education Details continuing pain management techniques as before to include massage and YOGA.    Person(s) Educated Patient   Methods Explanation   Comprehension Verbalized understanding             PT Long Term Goals - 05/09/15 1109    PT LONG TERM GOAL #2   Title Patient will report a modified ODI of less than 19% to demonstrate increased activity tolerance by 03/10/2015.    Baseline 28%   Time 6   Period Weeks   Status Achieved   PT  LONG TERM GOAL #3   Title Patient will report an NDI score of less than 27% to demonstrate increased activity tolerance by 03/10/2015.   Baseline 36%   Time 6   Period Weeks   Status Partially Met   PT LONG TERM GOAL #4   Title Patient will report a QuckDash score of less than 23% to demonstrate increased UE functional activity tolerance by 03/10/2015.    Baseline 31.8%   Time 6   Period Weeks   Status Partially Met   PT LONG TERM GOAL #5   Title Patient will be able to participate in her recreational activities (running, Pilates, Yoga) with less than 2/10 increase in VAS pain score to demonstrate return to prior level of activity by 03/10/2015.   Baseline Unable to jog or participate in yoga/pilates without significant increase in pain.    Time 6   Period Weeks   Status Achieved               Plan - 05/09/15 1107    Clinical Impression Statement Patient has made significant progress with pain and functional mobility. She has also had good response to community activities such as massage and Yoga. Patient will be discharged from our sevices at this time.  Pt will benefit from skilled therapeutic intervention in order to improve on the following deficits Pain;Decreased activity tolerance;Impaired UE functional use   Rehab Potential Fair   PT Frequency One time visit   PT Treatment/Interventions Cryotherapy;Manual techniques;Therapeutic activities;Therapeutic exercise;Taping;Dry needling   PT Next Visit Plan discharged   Consulted and Agree with Plan of Care Patient        Problem List There are no active problems to display for this patient.   Sebastian Dzik, PT, MPT, GCS 05/09/2015, 11:13 AM  [facility] [address] [place], [address] Phone: [phone]   Fax:  [phone]  Name: LANAYA BENNIS MRN: [id number] Date of Birth: 08-Sep-1980

## 2015-10-07 ENCOUNTER — Ambulatory Visit
Admission: RE | Admit: 2015-10-07 | Discharge: 2015-10-07 | Disposition: A | Discharge status: Home / Self Care | Payer: BLUE CROSS/BLUE SHIELD | Source: Ambulatory Visit | Attending: Medical | Admitting: Medical

## 2015-10-07 ENCOUNTER — Other Ambulatory Visit: Payer: Self-pay | Admitting: Medical

## 2015-10-07 DIAGNOSIS — M545 Low back pain: Secondary | ICD-10-CM | POA: Diagnosis not present

## 2015-10-07 DIAGNOSIS — Z975 Presence of (intrauterine) contraceptive device: Secondary | ICD-10-CM | POA: Insufficient documentation

## 2015-11-04 ENCOUNTER — Other Ambulatory Visit: Payer: Self-pay | Admitting: Medical

## 2015-11-04 DIAGNOSIS — M545 Low back pain: Secondary | ICD-10-CM

## 2015-11-06 ENCOUNTER — Encounter: Payer: Self-pay | Admitting: *Deleted

## 2015-11-06 ENCOUNTER — Other Ambulatory Visit: Payer: BLUE CROSS/BLUE SHIELD

## 2015-11-06 NOTE — Patient Instructions (Signed)
  Your procedure is scheduled on: 11-11-15 Report to [facility] 2ND FLOOR To find out your arrival time please call [phone] between 1PM - 3PM on 11-10-15  Remember: Instructions that are not followed completely may result in serious medical risk, up to and including death, or upon the discretion of your surgeon and anesthesiologist your surgery may need to be rescheduled.    _X___ 1. Do not eat food or drink liquids after midnight. No gum chewing or hard candies.     _X___ 2. No Alcohol for 24 hours before or after surgery.   ____ 3. Bring all medications with you on the day of surgery if instructed.    ____ 4. Notify your doctor if there is any change in your medical condition     (cold, fever, infections).     Do not wear jewelry, make-up, hairpins, clips or nail polish.  Do not wear lotions, powders, or perfumes. You may wear deodorant.  Do not shave 48 hours prior to surgery. Men may shave face and neck.  Do not bring valuables to the hospital.    [facility] is not responsible for any belongings or valuables.               Contacts, dentures or bridgework may not be worn into surgery.  Leave your suitcase in the car. After surgery it may be brought to your room.  For patients admitted to the hospital, discharge time is determined by your treatment team.   Patients discharged the day of surgery will not be allowed to drive home.   Please read over the following fact sheets that you were given:     ____ Take these medicines the morning of surgery with A SIP OF WATER:    1. NONE  2.   3.   4.  5.  6.  ____ Fleet Enema (as directed)   ____ Use CHG Soap as directed  ____ Use inhalers on the day of surgery  ____ Stop metformin 2 days prior to surgery    ____ Take 1/2 of usual insulin dose the night before surgery and none on the morning of surgery.   ____ Stop Coumadin/Plavix/aspirin-N/A  _X___ Stop Anti-inflammatories-NO NSAIDS OR ASPIRIN  PRODUCTS-TYLENOL OK TO TAKE   ____ Stop supplements until after surgery.    ____ Bring C-Pap to the hospital.

## 2015-11-11 ENCOUNTER — Ambulatory Visit
Admission: RE | Admit: 2015-11-11 | Discharge: 2015-11-11 | Disposition: A | Discharge status: Home / Self Care | Payer: BLUE CROSS/BLUE SHIELD | Source: Ambulatory Visit | Attending: Obstetrics & Gynecology | Admitting: Obstetrics & Gynecology

## 2015-11-11 ENCOUNTER — Encounter
Admission: RE | Disposition: A | Discharge status: Home / Self Care | Payer: Self-pay | Source: Ambulatory Visit | Attending: Obstetrics & Gynecology

## 2015-11-11 DIAGNOSIS — Z5309 Procedure and treatment not carried out because of other contraindication: Secondary | ICD-10-CM | POA: Diagnosis not present

## 2015-11-11 DIAGNOSIS — N906 Unspecified hypertrophy of vulva: Secondary | ICD-10-CM | POA: Insufficient documentation

## 2015-11-11 HISTORY — DX: Other specified postprocedural states: Z98.890

## 2015-11-11 HISTORY — DX: Major depressive disorder, single episode, unspecified: F32.9

## 2015-11-11 HISTORY — DX: Dorsalgia, unspecified: M54.9

## 2015-11-11 HISTORY — DX: Unspecified asthma, uncomplicated: J45.909

## 2015-11-11 HISTORY — DX: Nausea with vomiting, unspecified: R11.2

## 2015-11-11 HISTORY — DX: Depression, unspecified: F32.A

## 2015-11-11 HISTORY — DX: Adverse effect of unspecified anesthetic, initial encounter: T41.45XA

## 2015-11-11 HISTORY — DX: Other complications of anesthesia, initial encounter: T88.59XA

## 2015-11-11 SURGERY — LABIAPLASTY, VULVA
Anesthesia: Choice

## 2015-11-11 MED ORDER — FAMOTIDINE 20 MG PO TABS
ORAL_TABLET | ORAL | Status: AC
  Filled 2015-11-11: qty 1

## 2015-11-11 MED ORDER — LACTATED RINGERS IV SOLN: INTRAVENOUS | Status: DC

## 2015-11-11 MED ORDER — LIDOCAINE-EPINEPHRINE 1 %-1:100000 IJ SOLN
INTRAMUSCULAR | Status: AC
  Filled 2015-11-11: qty 1

## 2015-11-11 MED ORDER — FAMOTIDINE 20 MG PO TABS: 20.0000 mg | ORAL_TABLET | Freq: Once | ORAL | Status: DC

## 2015-11-11 SURGICAL SUPPLY — 29 items
BLADE SURG 15 STRL LF DISP TIS (BLADE) ×1 IMPLANT
BLADE SURG 15 STRL SS (BLADE) ×1
BLADE SURG SZ10 CARB STEEL (BLADE) ×2 IMPLANT
CANISTER SUCT 1200ML W/VALVE (MISCELLANEOUS) ×2 IMPLANT
CATH ROBINSON RED A/P 16FR (CATHETERS) ×2 IMPLANT
DRAPE PERI LITHO V/GYN (MISCELLANEOUS) ×2 IMPLANT
DRAPE UNDER BUTTOCK W/FLU (DRAPES) ×2 IMPLANT
DRSG TELFA 3X8 NADH (GAUZE/BANDAGES/DRESSINGS) ×2 IMPLANT
ELECT CAUTERY BLADE 6.4 (BLADE) ×2 IMPLANT
ELECT CAUTERY NEEDLE TIP 1.0 (MISCELLANEOUS) ×2
ELECT REM PT RETURN 9FT ADLT (ELECTROSURGICAL) ×2
ELECTRODE CAUTERY NEDL TIP 1.0 (MISCELLANEOUS) ×1 IMPLANT
ELECTRODE REM PT RTRN 9FT ADLT (ELECTROSURGICAL) ×1 IMPLANT
GLOVE BIO SURGEON STRL SZ7.5 (GLOVE) ×2 IMPLANT
GOWN STRL REUS W/ TWL LRG LVL3 (GOWN DISPOSABLE) ×1 IMPLANT
GOWN STRL REUS W/ TWL XL LVL3 (GOWN DISPOSABLE) ×1 IMPLANT
GOWN STRL REUS W/TWL LRG LVL3 (GOWN DISPOSABLE) ×1
GOWN STRL REUS W/TWL XL LVL3 (GOWN DISPOSABLE) ×1
NDL SAFETY 22GX1.5 (NEEDLE) ×2 IMPLANT
NS IRRIG 500ML POUR BTL (IV SOLUTION) ×2 IMPLANT
PACK BASIN MINOR ARMC (MISCELLANEOUS) IMPLANT
PAD OB MATERNITY 4.3X12.25 (PERSONAL CARE ITEMS) ×2 IMPLANT
PAD PREP 24X41 OB/GYN DISP (PERSONAL CARE ITEMS) ×2 IMPLANT
SPONGE XRAY 4X4 16PLY STRL (MISCELLANEOUS) IMPLANT
SUT CHROMIC 2 0 SH (SUTURE) IMPLANT
SUT VIC AB 0 CT1 27 (SUTURE)
SUT VIC AB 0 CT1 27XCR 8 STRN (SUTURE) IMPLANT
SUT VIC AB 2-0 CT1 (SUTURE) IMPLANT
SYR CONTROL 10ML (SYRINGE) ×2 IMPLANT

## 2015-11-11 NOTE — OR Nursing (Signed)
Patient reports eating chocolate at 0900 this am.  Dr Mordecai RasmussenVanstavern notified and in to see patient. Case cancelled Dr Tiburcio PeaHarris aware and desires patient call office to reschedule.

## 2015-11-11 NOTE — H&P (Signed)
History and Physical Interval Note:  11/11/2015 3:52 PM  Jasmin Henry  has presented today for surgery, with the diagnosis of LABIAL HYPERTROPHY,DYSPAREUNIA,RECURRENT UTI,  With plans for labioplasty as a surgical intervention .      Pt reports not being NPO, and so will reschedule case.   Letitia Libraobert Paul Deadrian Toya

## 2015-11-14 ENCOUNTER — Other Ambulatory Visit: Payer: BLUE CROSS/BLUE SHIELD

## 2015-11-20 ENCOUNTER — Encounter: Payer: Self-pay | Admitting: *Deleted

## 2015-11-20 ENCOUNTER — Inpatient Hospital Stay: Admission: RE | Admit: 2015-11-20 | Payer: BLUE CROSS/BLUE SHIELD | Source: Ambulatory Visit

## 2015-11-20 NOTE — Patient Instructions (Signed)
  Your procedure is scheduled on: 11-28-15 Report to [facility] 2ND FLOOR To find out your arrival time please call [phone] between 1PM - 3PM on 11-27-15  Remember: Instructions that are not followed completely may result in serious medical risk, up to and including death, or upon the discretion of your surgeon and anesthesiologist your surgery may need to be rescheduled.    _X___ 1. Do not eat food or drink liquids after midnight. No gum chewing or hard candies.     _X___ 2. No Alcohol for 24 hours before or after surgery.   ____ 3. Bring all medications with you on the day of surgery if instructed.    ____ 4. Notify your doctor if there is any change in your medical condition     (cold, fever, infections).     Do not wear jewelry, make-up, hairpins, clips or nail polish.  Do not wear lotions, powders, or perfumes. You may wear deodorant.  Do not shave 48 hours prior to surgery. Men may shave face and neck.  Do not bring valuables to the hospital.    [facility] is not responsible for any belongings or valuables.               Contacts, dentures or bridgework may not be worn into surgery.  Leave your suitcase in the car. After surgery it may be brought to your room.  For patients admitted to the hospital, discharge time is determined by your treatment team.   Patients discharged the day of surgery will not be allowed to drive home.   Please read over the following fact sheets that you were given:    ____ Take these medicines the morning of surgery with A SIP OF WATER:    1. NONE  2.   3.   4.  5.  6.  ____ Fleet Enema (as directed)   ____ Use CHG Soap as directed  ____ Use inhalers on the day of surgery  ____ Stop metformin 2 days prior to surgery    ____ Take 1/2 of usual insulin dose the night before surgery and none on the morning of surgery.   ____ Stop Coumadin/Plavix/aspirin-N/A  X____ Stop Anti-inflammatories-NO NSAIDS OR ASA  PRODUCTS-TYLENOL OK TO TAKE   ____ Stop supplements until after surgery.    ____ Bring C-Pap to the hospital.

## 2015-11-21 ENCOUNTER — Ambulatory Visit
Admission: RE | Admit: 2015-11-21 | Discharge: 2015-11-21 | Disposition: A | Discharge status: Home / Self Care | Payer: BLUE CROSS/BLUE SHIELD | Source: Ambulatory Visit | Attending: Medical | Admitting: Medical

## 2015-11-21 DIAGNOSIS — M545 Low back pain: Secondary | ICD-10-CM

## 2015-11-28 ENCOUNTER — Ambulatory Visit: Payer: BLUE CROSS/BLUE SHIELD | Admitting: Anesthesiology

## 2015-11-28 ENCOUNTER — Other Ambulatory Visit: Payer: BLUE CROSS/BLUE SHIELD

## 2015-11-28 ENCOUNTER — Encounter: Payer: Self-pay | Admitting: *Deleted

## 2015-11-28 ENCOUNTER — Ambulatory Visit
Admission: RE | Admit: 2015-11-28 | Discharge: 2015-11-28 | Disposition: A | Discharge status: Home / Self Care | Payer: BLUE CROSS/BLUE SHIELD | Source: Ambulatory Visit | Attending: Obstetrics & Gynecology | Admitting: Obstetrics & Gynecology

## 2015-11-28 ENCOUNTER — Encounter
Admission: RE | Disposition: A | Discharge status: Home / Self Care | Payer: Self-pay | Source: Ambulatory Visit | Attending: Obstetrics & Gynecology

## 2015-11-28 DIAGNOSIS — N906 Unspecified hypertrophy of vulva: Secondary | ICD-10-CM | POA: Diagnosis present

## 2015-11-28 DIAGNOSIS — F329 Major depressive disorder, single episode, unspecified: Secondary | ICD-10-CM | POA: Diagnosis not present

## 2015-11-28 DIAGNOSIS — N941 Unspecified dyspareunia: Secondary | ICD-10-CM | POA: Diagnosis present

## 2015-11-28 DIAGNOSIS — Z87440 Personal history of urinary (tract) infections: Secondary | ICD-10-CM | POA: Diagnosis not present

## 2015-11-28 DIAGNOSIS — N9419 Other specified dyspareunia: Secondary | ICD-10-CM | POA: Insufficient documentation

## 2015-11-28 DIAGNOSIS — J45909 Unspecified asthma, uncomplicated: Secondary | ICD-10-CM | POA: Insufficient documentation

## 2015-11-28 HISTORY — PX: LABIOPLASTY: SHX1900

## 2015-11-28 LAB — CBC
HEMATOCRIT: 37.6 % (ref 35.0–47.0)
HEMOGLOBIN: 12.5 g/dL (ref 12.0–16.0)
MCH: 30.4 pg (ref 26.0–34.0)
MCHC: 33.2 g/dL (ref 32.0–36.0)
MCV: 91.8 fL (ref 80.0–100.0)
Platelets: 285 10*3/uL (ref 150–440)
RBC: 4.1 MIL/uL (ref 3.80–5.20)
RDW: 14.5 % (ref 11.5–14.5)
WBC: 8.9 10*3/uL (ref 3.6–11.0)

## 2015-11-28 LAB — TYPE AND SCREEN
ABO/RH(D): O POS
ANTIBODY SCREEN: NEGATIVE

## 2015-11-28 LAB — POCT PREGNANCY, URINE: PREG TEST UR: NEGATIVE

## 2015-11-28 SURGERY — LABIAPLASTY, VULVA
Anesthesia: General | Laterality: Bilateral

## 2015-11-28 MED ORDER — FAMOTIDINE 20 MG PO TABS
20.0000 mg | ORAL_TABLET | Freq: Once | ORAL | Status: DC
Start: 2015-11-28 — End: 2015-11-28

## 2015-11-28 MED ORDER — ESTROGENS, CONJUGATED 0.625 MG/GM VA CREA
TOPICAL_CREAM | VAGINAL | Status: AC
  Filled 2015-11-28: qty 30

## 2015-11-28 MED ORDER — FENTANYL CITRATE (PF) 100 MCG/2ML IJ SOLN
25.0000 ug | INTRAMUSCULAR | Status: DC | PRN
  Administered 2015-11-28 (×2): 50 ug via INTRAVENOUS

## 2015-11-28 MED ORDER — FAMOTIDINE 20 MG PO TABS
ORAL_TABLET | ORAL | Status: AC
  Filled 2015-11-28: qty 1

## 2015-11-28 MED ORDER — KETOROLAC TROMETHAMINE 60 MG/2ML IM SOLN
INTRAMUSCULAR | Status: AC
  Administered 2015-11-28: 30 mg
  Filled 2015-11-28: qty 2

## 2015-11-28 MED ORDER — BUPIVACAINE HCL (PF) 0.5 % IJ SOLN
INTRAMUSCULAR | Status: AC
  Filled 2015-11-28: qty 30

## 2015-11-28 MED ORDER — OXYCODONE-ACETAMINOPHEN 5-325 MG PO TABS: 1.0000 | ORAL_TABLET | ORAL | Status: DC | PRN

## 2015-11-28 MED ORDER — FENTANYL CITRATE (PF) 100 MCG/2ML IJ SOLN
INTRAMUSCULAR | Status: DC | PRN
  Administered 2015-11-28 (×2): 50 ug via INTRAVENOUS

## 2015-11-28 MED ORDER — MIDAZOLAM HCL 2 MG/2ML IJ SOLN
INTRAMUSCULAR | Status: DC | PRN
  Administered 2015-11-28: 2 mg via INTRAVENOUS

## 2015-11-28 MED ORDER — KETOROLAC TROMETHAMINE 30 MG/ML IJ SOLN
30.0000 mg | Freq: Four times a day (QID) | INTRAMUSCULAR | Status: DC
  Filled 2015-11-28: qty 1

## 2015-11-28 MED ORDER — LIDOCAINE HCL (CARDIAC) 20 MG/ML IV SOLN
INTRAVENOUS | Status: DC | PRN
  Administered 2015-11-28: 50 mg via INTRAVENOUS

## 2015-11-28 MED ORDER — ONDANSETRON HCL 4 MG/2ML IJ SOLN
INTRAMUSCULAR | Status: DC | PRN
  Administered 2015-11-28: 4 mg via INTRAVENOUS

## 2015-11-28 MED ORDER — PROPOFOL 500 MG/50ML IV EMUL
INTRAVENOUS | Status: DC | PRN
  Administered 2015-11-28: 30 ug via INTRAVENOUS
  Administered 2015-11-28: 20 ug via INTRAVENOUS
  Administered 2015-11-28 (×3): 30 ug via INTRAVENOUS
  Administered 2015-11-28: 20 ug via INTRAVENOUS

## 2015-11-28 MED ORDER — LACTATED RINGERS IV SOLN
INTRAVENOUS | Status: DC
  Administered 2015-11-28: 13:00:00 via INTRAVENOUS

## 2015-11-28 MED ORDER — FENTANYL CITRATE (PF) 100 MCG/2ML IJ SOLN
INTRAMUSCULAR | Status: AC
  Filled 2015-11-28: qty 2

## 2015-11-28 MED ORDER — PROPOFOL 500 MG/50ML IV EMUL
INTRAVENOUS | Status: DC | PRN
  Administered 2015-11-28: 75 ug/kg/min via INTRAVENOUS

## 2015-11-28 MED ORDER — ONDANSETRON HCL 4 MG/2ML IJ SOLN: 4.0000 mg | Freq: Once | INTRAMUSCULAR | Status: DC | PRN

## 2015-11-28 SURGICAL SUPPLY — 27 items
BAG URO DRAIN 2000ML W/SPOUT (MISCELLANEOUS) ×2 IMPLANT
BASIN GRAD PLASTIC 32OZ STRL (MISCELLANEOUS) ×2 IMPLANT
BLADE SURG SZ10 CARB STEEL (BLADE) ×4 IMPLANT
CANISTER SUCT 1200ML W/VALVE (MISCELLANEOUS) ×2 IMPLANT
CATH ROBINSON RED A/P 16FR (CATHETERS) ×2 IMPLANT
COUNTER NEEDLE 20/40 LG (NEEDLE) ×2 IMPLANT
DRAPE PERI LITHO V/GYN (MISCELLANEOUS) ×2 IMPLANT
DRAPE SHEET LG 3/4 BI-LAMINATE (DRAPES) ×2 IMPLANT
ELECT REM PT RETURN 9FT ADLT (ELECTROSURGICAL) ×2
ELECTRODE REM PT RTRN 9FT ADLT (ELECTROSURGICAL) ×1 IMPLANT
GLOVE BIO SURGEON STRL SZ8 (GLOVE) ×2 IMPLANT
GOWN STRL REUS W/ TWL LRG LVL3 (GOWN DISPOSABLE) ×2 IMPLANT
GOWN STRL REUS W/TWL LRG LVL3 (GOWN DISPOSABLE) ×2
HANDLE YANKAUER SUCT BULB TIP (MISCELLANEOUS) ×2 IMPLANT
LABEL OR SOLS (LABEL) ×2 IMPLANT
NS IRRIG 1000ML POUR BTL (IV SOLUTION) ×2 IMPLANT
PENCIL ELECTRO HAND CTR (MISCELLANEOUS) ×2 IMPLANT
SPONGE XRAY 4X4 16PLY STRL (MISCELLANEOUS) ×2 IMPLANT
STRAP SAFETY BODY (MISCELLANEOUS) ×2 IMPLANT
SUT CHROMIC 2 0 CT 1 (SUTURE) ×2 IMPLANT
SUT CHROMIC 3 0 SH 27 (SUTURE) ×2 IMPLANT
SUT VIC AB 0 CT1 36 (SUTURE) ×2 IMPLANT
SUT VIC AB 2-0 SH 27 (SUTURE) ×1
SUT VIC AB 2-0 SH 27XBRD (SUTURE) ×1 IMPLANT
SUT VIC AB 4-0 PS2 18 (SUTURE) ×4 IMPLANT
SYRINGE 10CC LL (SYRINGE) ×2 IMPLANT
TUBING CONNECTING 10 (TUBING) ×2 IMPLANT

## 2015-11-28 NOTE — Transfer of Care (Signed)
Immediate Anesthesia Transfer of Care Note  Patient: Jasmin Henry  Procedure(s) Performed: Procedure(s): LABIAPLASTY (Bilateral)  Patient Location: PACU  Anesthesia Type:General  Level of Consciousness: awake, alert  and oriented  Airway & Oxygen Therapy: Patient Spontanous Breathing  Post-op Assessment: Post -op Vital signs reviewed and stable  Post vital signs: stable  Last Vitals:  Filed Vitals:   11/28/15 1146 11/28/15 1415  BP: 114/51 113/65  Pulse: 83 74  Temp: 36.8 C 36.2 C  Resp: 16 18    Last Pain:  Filed Vitals:   11/28/15 1417  PainSc: 0-No pain         Complications: No apparent anesthesia complications

## 2015-11-28 NOTE — Op Note (Signed)
Jasmin PonderWhitney A Henry PROCEDURE DATE: 11/28/2015  PREOPERATIVE DIAGNOSIS: Bilateral Labial Hypertrophy, Dyspareunia POSTOPERATIVE DIAGNOSIS: The same PROCEDURE: Bilateral Labioplasty SURGEON: Annamarie MajorPaul Harris, MD, FACOG  INDICATIONS: 35 y.o.  WF with bilateral labial hypertrophy leading to dyspareunia and recurrent UTI.  Risks of surgery were discussed with the patient including but not limited to: bleeding, infection which may require antibiotics, injury to surrounding organs, need for additional procedures, and other postoperative/anesthesia complications. Written informed consent was obtained.    FINDINGS:  Bilateral labial hypertrophy  ANESTHESIA:    General ESTIMATED BLOOD LOSS: min SPECIMENS: None COMPLICATIONS: None immediate  PROCEDURE IN DETAIL:  The patient was taken to the operating room where heavy sedation was administered and was found to be adequate.  She was placed in the dorsal lithotomy position, and was prepped and draped in a sterile manner.   After an adequate timeout was performed, attention was turned to the right side of her vulva where the excess labial tissue is identified and marked for excision, and the same is performed on the left to maintain symmetry.  Marcaine 0.5% is used to inject the subcutaneous tissues.  Tissue excised with a scapel.  Skin edges approximated with 4-0 vicryl suture.  Skin adhesive placed across the surface.  No bleeding noted.  Good cosmesis on the closure and no distortion or involvement near clitoris or urethra.    Patient goes to PACU in good condition; all sponges, sharps, and instruments accounted for.

## 2015-11-28 NOTE — Anesthesia Preprocedure Evaluation (Signed)
Anesthesia Evaluation  Patient identified by MRN, date of birth, ID band Patient awake    Reviewed: Allergy & Precautions, H&P , NPO status , Patient's Chart, lab work & pertinent test results, reviewed documented beta blocker date and time   History of Anesthesia Complications (+) PONV and history of anesthetic complications  Airway Mallampati: II  TM Distance: >3 FB Neck ROM: full    Dental no notable dental hx. (+) Teeth Intact   Pulmonary neg shortness of breath, asthma , neg sleep apnea, neg COPD, neg recent URI,    Pulmonary exam normal breath sounds clear to auscultation       Cardiovascular Exercise Tolerance: Good negative cardio ROS Normal cardiovascular exam Rhythm:regular Rate:Normal     Neuro/Psych PSYCHIATRIC DISORDERS (Depression) negative neurological ROS     GI/Hepatic negative GI ROS, Neg liver ROS,   Endo/Other  negative endocrine ROS  Renal/GU negative Renal ROS  negative genitourinary   Musculoskeletal   Abdominal   Peds  Hematology negative hematology ROS (+)   Anesthesia Other Findings Past Medical History:   Complication of anesthesia                                   PONV (postoperative nausea and vomiting)                       Comment:AFTER C-SECTION   Asthma                                                         Comment:SEASONAL ALLERGIES TRIGGER ASTHMA-NO INHALERS   Depression                                                     Comment:NO MEDS   Back pain                                                    Reproductive/Obstetrics negative OB ROS                             Anesthesia Physical Anesthesia Plan  ASA: II  Anesthesia Plan: General   Post-op Pain Management:    Induction:   Airway Management Planned:   Additional Equipment:   Intra-op Plan:   Post-operative Plan:   Informed Consent: I have reviewed the patients History and  Physical, chart, labs and discussed the procedure including the risks, benefits and alternatives for the proposed anesthesia with the patient or authorized representative who has indicated his/her understanding and acceptance.   Dental Advisory Given  Plan Discussed with: Anesthesiologist, CRNA and Surgeon  Anesthesia Plan Comments:         Anesthesia Quick Evaluation

## 2015-11-28 NOTE — Discharge Instructions (Signed)
General Gynecological Post-Operative Instructions You may expect to feel dizzy, weak, and drowsy for as long as 24 hours after receiving the medicine that made you sleep (anesthetic).  Do not drive a car, ride a bicycle, participate in physical activities, or take public transportation until you are done taking narcotic pain medicines or as directed by your doctor.  Do not drink alcohol or take tranquilizers.  Do not take medicine that has not been prescribed by your doctor.  Do not sign important papers or make important decisions while on narcotic pain medicines.  Have a responsible person with you.  CARE OF INCISION  Keep incision clean and dry. Take showers instead of baths until your doctor gives you permission to take baths.  Avoid activities that may risk injury to your surgical site.  No sexual intercourse or placement of anything in the vagina for 4 weeks or as instructed by your doctor. DO NOT BOTHER STITCH SITE/ SKIN GLUE for 2 weeks (OK for it to start washing off thereafter) Only take prescription or over-the-counter medicines  for pain, discomfort, or fever as directed by your doctor. Do not take aspirin. It can make you bleed. Take medicines (antibiotics) that kill germs if they are prescribed for you.  Call the office or go to the ER if:  You feel sick to your stomach (nauseous) and you start to throw up (vomit).  You have trouble eating or drinking.  You have an oral temperature above 101.  You have constipation that is not helped by adjusting diet or increasing fluid intake. Pain medicines are a common cause of constipation.  You have any other concerns. SEEK IMMEDIATE MEDICAL CARE IF:  You have persistent dizziness.  You have difficulty breathing or a congested sounding (croupy) cough.  You have an oral temperature above 102.5, not controlled by medicine.  There is increasing pain or tenderness near or in the surgical site.    AMBULATORY SURGERY  DISCHARGE  INSTRUCTIONS   1) The drugs that you were given will stay in your system until tomorrow so for the next 24 hours you should not:  A) Drive an automobile B) Make any legal decisions C) Drink any alcoholic beverage   2) You may resume regular meals tomorrow.  Today it is better to start with liquids and gradually work up to solid foods.  You may eat anything you prefer, but it is better to start with liquids, then soup and crackers, and gradually work up to solid foods.   3) Please notify your doctor immediately if you have any unusual bleeding, trouble breathing, redness and pain at the surgery site, drainage, fever, or pain not relieved by medication.    4) Additional Instructions:        Please contact your physician with any problems or Same Day Surgery at [phone], Monday through Friday 6 am to 4 pm, or [place] at [facility] Main number at [phone].

## 2015-11-28 NOTE — H&P (Signed)
History and Physical Interval Note:  11/28/2015 12:42 PM  Jasmin Henry  has presented today for surgery, with the diagnosis of LABIAL HYPERTROPHY,DYSPAREUNIA,RECURRENT UTI  The various methods of treatment have been discussed with the patient and family. After consideration of risks, benefits and other options for treatment, the patient has consented to  Procedure(s): LABIAPLASTY (Bilateral) as a surgical intervention .  The patient's history has been reviewed, patient examined, no change in status, stable for surgery.  Pt has the following beta blocker history-  Not taking Beta Blocker.  I have reviewed the patient's chart and labs.  Questions were answered to the patient's satisfaction.    Letitia Libraobert Paul Tiyana Galla

## 2015-12-01 ENCOUNTER — Encounter: Payer: Self-pay | Admitting: Obstetrics & Gynecology

## 2015-12-01 NOTE — Anesthesia Postprocedure Evaluation (Addendum)
Anesthesia Post Note  Patient: Jasmin PonderWhitney A Henry  Procedure(s) Performed: Procedure(s) (LRB): LABIAPLASTY (Bilateral)  Patient location during evaluation: PACU Anesthesia Type: General Level of consciousness: awake and alert Pain management: pain level controlled Vital Signs Assessment: post-procedure vital signs reviewed and stable Respiratory status: spontaneous breathing, nonlabored ventilation, respiratory function stable and patient connected to nasal cannula oxygen Cardiovascular status: blood pressure returned to baseline and stable Postop Assessment: no signs of nausea or vomiting Anesthetic complications: no    Last Vitals:  Filed Vitals:   11/28/15 1515 11/28/15 1540  BP: 122/63 107/48  Pulse: 77 76  Temp:    Resp:      Last Pain:  Filed Vitals:   11/28/15 1541  PainSc: 2                  Lenard SimmerAndrew Chaitanya Amedee

## 2016-07-17 DIAGNOSIS — J329 Chronic sinusitis, unspecified: Secondary | ICD-10-CM | POA: Insufficient documentation

## 2016-07-17 DIAGNOSIS — M545 Low back pain, unspecified: Secondary | ICD-10-CM | POA: Insufficient documentation

## 2016-07-17 DIAGNOSIS — Z889 Allergy status to unspecified drugs, medicaments and biological substances status: Secondary | ICD-10-CM

## 2016-07-22 ENCOUNTER — Ambulatory Visit: Payer: BLUE CROSS/BLUE SHIELD | Attending: Registered Nurse

## 2016-07-22 DIAGNOSIS — M6281 Muscle weakness (generalized): Secondary | ICD-10-CM | POA: Diagnosis not present

## 2016-07-22 DIAGNOSIS — M5442 Lumbago with sciatica, left side: Secondary | ICD-10-CM

## 2016-07-22 NOTE — Patient Instructions (Addendum)
Standing lateral glide    TO BE PERFORMED IN STANDING. Stand with L arm on wall. Put R hand on hip and press toward wall forming "C" shape with lower back. Hold 2-3 seconds and then relax and return to neutral. Perform 2 x 10 repetition 3 times/day.   Prone press using arms    Lying face down with arms bent, inhale. Then while exhaling, straighten arms. Hold __3__ seconds. Slowly return to starting position. Can start with offset press-ups with hips to the L and chest to the R (just like with the lateral glides in standing). Perform 2 x 10, 3 times/day.   Hip Extension    Lying face down, raise leg just off floor. Keep leg straight. Hold 2 count. Lower leg to floor. Repeat __10__ times each leg. 3 x 10. 2 times/day  Bridge    Lie back, legs bent. Inhale, pressing hips up. Keeping ribs in, lengthen lower back. Exhale, rolling down along spine from top. Repeat _10___ times. 3 sets of 10 repetitions Do _2___ sessions per day.

## 2016-07-23 NOTE — Therapy (Signed)
[facility] [address] [place], [address] Phone: [phone]   Fax:  [phone]  Physical Therapy Evaluation  Patient Details  Name: Jasmin Henry MRN: [id number] Date of Birth: 05-27-1981 Referring Provider: Albina Billet NP-C  Encounter Date: 07/22/2016      PT End of Session - 07/23/16 0847    Visit Number 1   Number of Visits 9   Date for PT Re-Evaluation 08/20/16   Authorization Type no g codes   PT Start Time 1455   PT Stop Time 1555   PT Time Calculation (min) 60 min   Activity Tolerance Patient tolerated treatment well   Behavior During Therapy [facility] for tasks assessed/performed      Past Medical History:  Diagnosis Date  . Anxiety   . Asthma    SEASONAL ALLERGIES TRIGGER ASTHMA-NO INHALERS  . Back pain   . Complication of anesthesia   . Depression    NO MEDS  . PONV (postoperative nausea and vomiting)    AFTER C-SECTION    Past Surgical History:  Procedure Laterality Date  . CESAREAN SECTION  DEC 2008  . LABIOPLASTY Bilateral 11/28/2015   Procedure: LABIAPLASTY;  Surgeon: Nadara Mustard, MD;  Location: [facility] ORS;  Service: Gynecology;  Laterality: Bilateral;  . WISDOM TOOTH EXTRACTION      There were no vitals filed for this visit.       Subjective Assessment - 07/22/16 1509    Subjective Low back pain, central and radiating LLE   Pertinent History Pt reports she started having increased low back pain last Thursday 07/15/16. No known MOI. She reports that she was home that day because of the snow and was on her feet most of the day with her kids. It started as dull, throbbing, lower back pain. Pain worsened that day and was worse for the next few days. She does get some radiating pain down from her low back into her tailbone and then into her posterior L thigh to the level of the knee. Pt went to her PCP on Monday who prescribed her pain medication/muscle relaxers and also gave  her a shot of Toradol. She went back yesterday and they started her on a prednisone taper as well . Pt states she slept better last night after starting the steroids and her back has felt better today. She has a history of 4 separate "flare-ups" of her back pain over the course of multiple years and the most recent was last March 2017. At that time she was out of work for multiple weeks. She states that her symptoms improved with medication and prone press-ups. She had an MRI at that time but never returned to get the results. She most recently received the results and it showed a central disc protrusion at L5-S1 causing mild left subarticular lateral recess stenosis. She is going to have a consult with neurosurgery to discuss this finding but would like to avoid surgery. 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 For leisure she likes to do yoga and hike as well as play with her kids. Just prior to her back pain she was having a low grade fever (99.1) and was treated with Tamiflu despite a negative flu swab. This has persisted. Pt has no upper respiratory or urinary symptoms (history of frequent UTIs). Pt encouraged to monitor and notify PCP if symptoms don't resolve. ROS negative for red  flags. Denies bowel/bladder changes or saddle paresthesia   How long can you sit comfortably? 4 minutes   How long can you stand comfortably? 4 minutes   How long can you walk comfortably? 5 minutes   Diagnostic tests MRI: central disc protrusion at L5-S1 causing mild left subarticular lateral recess stenosis    Patient Stated Goals Decrease pain and return to yoga/hiking. Would like to be able to play with her    Currently in Pain? Yes   Pain Score 6   Best: 2/10; Worst: 9/10   Pain Location Back   Pain Orientation --  Central low back pain radiating into L posterior thigh   Pain Descriptors / Indicators Dull;Aching   Pain Type Acute pain  Acute on chronic   Pain  Radiating Towards LLE, posterior thigh to level of knee   Pain Onset In the past 7 days   Pain Frequency Constant   Aggravating Factors  flexing neck, lumbar rotation bilaterally, standing or sitting for extended period of time   Pain Relieving Factors medication, steroids, laying on back with towel roll under back, hot baths, prone press-ups have not been as helpful during this recent flare-up   Multiple Pain Sites No            [facility] PT Assessment - 07/22/16 1515      Assessment   Medical Diagnosis Low back pain   Referring Provider Albina Billet NP-C   Onset Date/Surgical Date 07/15/16   Hand Dominance Right   Next MD Visit None scheduled with PCP. Planning to have appt scheduled with neurosurgery   Prior Therapy Yes     Precautions   Precautions None     Restrictions   Weight Bearing Restrictions No     Balance Screen   Has the patient fallen in the past 6 months No   Has the patient had a decrease in activity level because of a fear of falling?  Yes   Is the patient reluctant to leave their home because of a fear of falling?  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 Spends a lot of time sitting at computer. Has a standing desk but doesn't come up high enough for monitor to be at eye level   Leisure yoga, hiking, playing with her children     Cognition   Overall Cognitive Status Within Functional Limits for tasks assessed     Observation/Other Assessments   Other Surveys  Other Surveys   Modified [place] 74%     Sensation   Additional Comments Denies N/T in bilateral LE. Intact sensation L2-S2 to light touch     Posture/Postural Control   Posture Comments Grossly WFL. No lateral shift and  no abnormalities noted in standing     ROM / Strength   AROM / PROM / Strength AROM;Strength     AROM   Overall AROM Comments Lumbar flexion is grossly full with good segmental mobility. Painful with peripheralization of symptoms. Lumbar extension WFL and symptoms appears to centralize with repeated movements. Lateral flexion is painful to the L and painless to the R. Rotation not assessed at this time. Repeated side glides pressing pelvis to the left centralizes and relieves  pain, opposite direction no change in pain. CPA painless and grossly WFL throughout thoracic spine. Lumbar spine is grossly normal with respect ot mobility but increase in pain with central and L UPA at L2. Pt reports relief of pain with CPA to L4 and L5. Positive SLR bilaterally at 58 degrees on RLE and 50 degrees LLE. Pt reports reproduction of L radicular symptoms during R SLR. Positive slump bilaterally and positive Ely for back pain bilaterally. Negative hip scour. Localized pain with deep palpation of L piriformis but no positive reproduction of patient's pain and no radicular symptoms. Hip internal and external ROM appears grossly WFL. Pt reports some low back pain with L hip internal rotation     Strength   Overall Strength Comments Appears grossly WFL throughout bilateral LE with the exception of 4-/5 hip extension. Did not formally test hip abduction/adduction strength but function in sitting clamshell position. Pt has difficulty with resisted knee extension in sitting due to reproduction of low back pain with knee extension (positive slump bilaterally).      Palpation   Palpation comment No pain with palpation to bilateral lumbar paraspinals. No spinous process step-off noted. Pt reports pain that radiates from low back into tailbone. No pain with palpation of tailbone     Ambulation/Gait   Gait Comments No gross abnormalities in gait noted but full gait screening deferred.         TREATMENT  MANUAL  THERAPY Standing lumbar side glides L arm on wall pressing hip to the L with right hand, 2s hold 2 x 10; Prone offset press-ups with chest stacked to the right of hips and OP applied by therapist x 10; Prone hip extension x 10 bilaterally; Hooklying bridges x 10; Pt provided written HEP with education about how to perform exercises.                     PT Education - 07/23/16 0847    Education provided Yes   Education Details HEP and plan of care   Person(s) Educated Patient   Methods Explanation;Demonstration;Verbal cues;Handout   Comprehension Verbalized understanding;Returned demonstration             PT Long Term Goals - 07/23/16 0900      PT LONG TERM GOAL #1   Title Patient will be independent with HEP to decrease pain and return to yoga   Time 4   Period Weeks   Status New     PT LONG TERM GOAL #2   Title Pt will decrease mODI scoreby at least 13 points in order demonstrate clinically significant reduction in pain/disability   Baseline 07/22/16: 74%   Time 4   Period Weeks   Status New     PT LONG TERM GOAL #3   Title Pt will report worst pain as no greater than 5/10 on NPRS with aggravating activities in order to tolerate work and household responsibilities   Baseline 07/22/16: worst: 9/10   Time 4   Period Weeks   Status New     PT LONG TERM GOAL #4   Title Pt will be able to sit for at least 30 minutes at a time before needing to change positions in order to complete all of her work responsibilities   Baseline 07/22/16: 4 minutes   Time 4   Period Weeks   Status New               Plan - 07/23/16 [id number]  Clinical Impression Statement Pt is a pleasant 36 yo female referred for acute low back pain with radicular symptoms into LLE since last Thursday 07/15/16. No known MOI. Pt has a history of prior similar episodes the last of which occurred March 2017. Lumbar MRI from 2017 showed a central disc protrusion at L5-S1 causing mild left  subarticular lateral recess stenosis. PT examination today reveals positive slump, SLR, and Ely test bilaterally for reproduction of low back pain. No numbness/tingling identified in bilateral LE and no focal weakness. Pt denies bowel/bladder changes or saddle paresthesia. Pain centralizes and decreases with extension specific exercises, lumbar side glides, and posterior to anterior mobilizations of lower lumbar spine. Weakness noted bilaterally in hip extension during strength testing. Pt reports history of improvement during prior episodes with extension-specific exercises (prone press-ups) and stretches. Pt provided HEP that includes lumbar side glides, prone press-ups, prone hip extension, and hooklying bridges. Pt will benefit from skilled PT services to address deficits and decrease pain.   Rehab Potential Excellent   Clinical Impairments Affecting Rehab Potential Positive: motivation, history of prior improvement and recent improvement; Negative: repeated recurrence of symptoms   PT Frequency 2x / week   PT Duration 4 weeks   PT Treatment/Interventions Aquatic Therapy;Cryotherapy;Electrical Stimulation;Iontophoresis 4mg /ml Dexamethasone;Moist Heat;Traction;Ultrasound;Gait training;Functional mobility training;Therapeutic activities;Therapeutic exercise;Neuromuscular re-education;Patient/family education;Balance training;Manual techniques;Passive range of motion;Dry needling   PT Next Visit Plan Progress lumbar side glides, offset press-ups moving toward in line press-ups, lumbar encouraging exercises (prone hip extension, bridges, etc)   PT Home Exercise Plan Standing side glide (left arm on wall pressing pelvis to the L), prone offset press-ups, prone hip extension, hooklying bridges   Consulted and Agree with Plan of Care Patient      Patient will benefit from skilled therapeutic intervention in order to improve the following deficits and impairments:  Decreased strength, Pain  Visit  Diagnosis: Acute midline low back pain with left-sided sciatica - Plan: PT plan of care cert/re-cert  Muscle weakness (generalized) - Plan: PT plan of care cert/re-cert     Problem List Patient Active Problem List   Diagnosis Date Noted  . Sinusitis 07/17/2016  . Low back pain 07/17/2016  . History of seasonal allergies 07/17/2016  . 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 Dyspareunia in female 11/28/2015   Lynnea Maizes PT, DPT   Ethridge Sollenberger 07/23/2016, 9:09 AM  [facility] SPORTS MEDICINE [address] [place], [address] Phone: [phone]   Fax:  [phone]  Name: Jasmin Henry MRN: [id number] Date of Birth: 1981-02-20

## 2016-07-28 ENCOUNTER — Ambulatory Visit: Payer: BLUE CROSS/BLUE SHIELD | Admitting: Physical Therapy

## 2016-08-06 IMAGING — MR MR SACRUM / SI JOINTS WO CM
4 of 5 series · 19 of 48 positions shown · non-contrast
Comparison: 10/07/2015 radiographs

CLINICAL DATA: Centralized low back pain radiating to the left
posterior leg to knee. Pain over the sacrum. Pain has worsened over
the last 2 months.

EXAM:
MR SACRUM WITHOUT CONTRAST
TECHNIQUE: Multiplanar, multisequence MR imaging was performed. No intravenous
contrast was administered.

[Series 3: T1 · sagittal · 4.0mm · 0.47mm/px · 9 of 28 slices shown (1 of 3)]
[im 1/28]
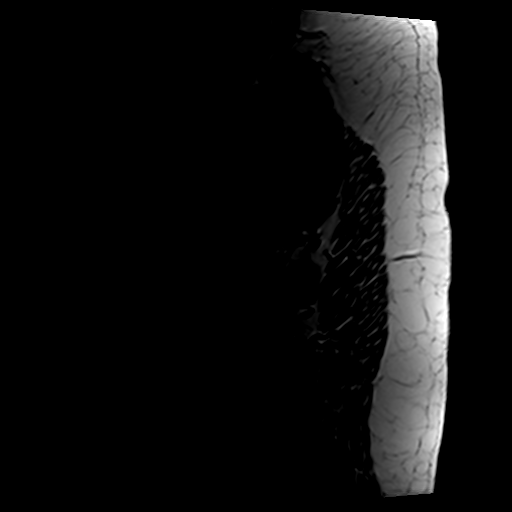
[im 4/28]
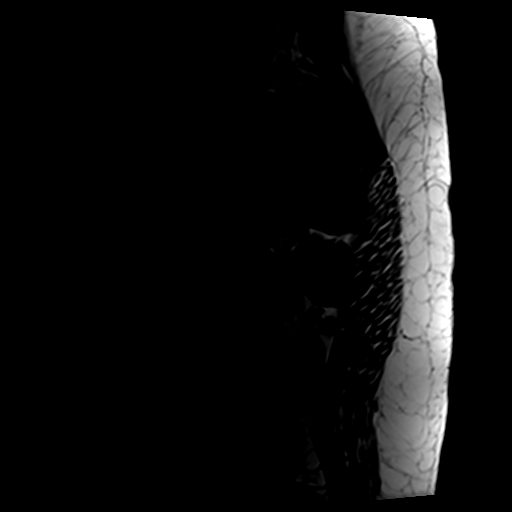
[im 7/28]
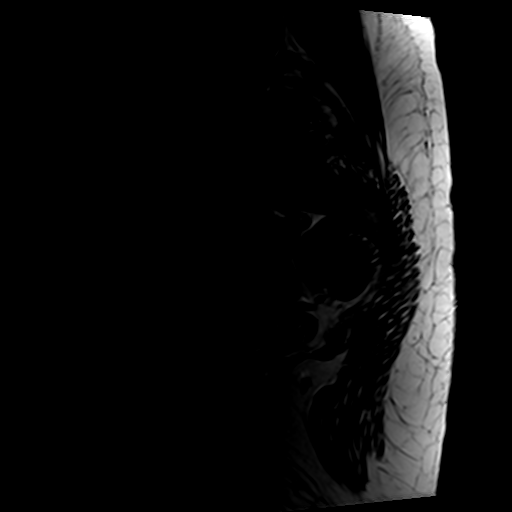
[im 11/28]
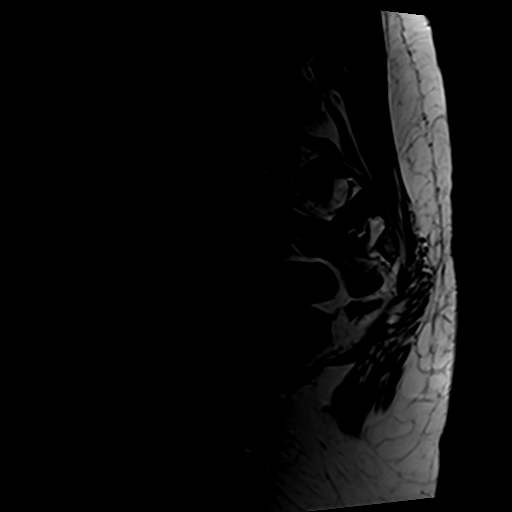
[im 14/28]
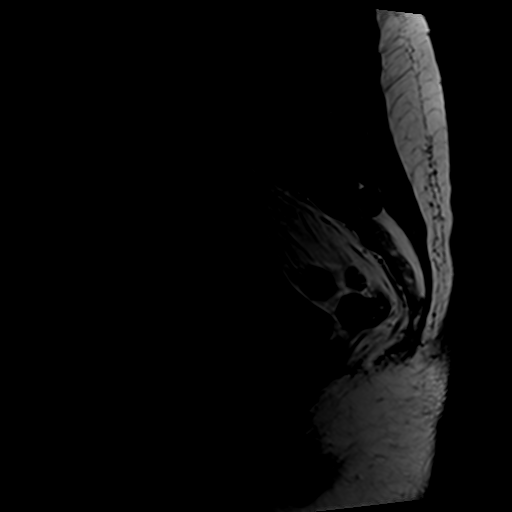
[im 17/28]
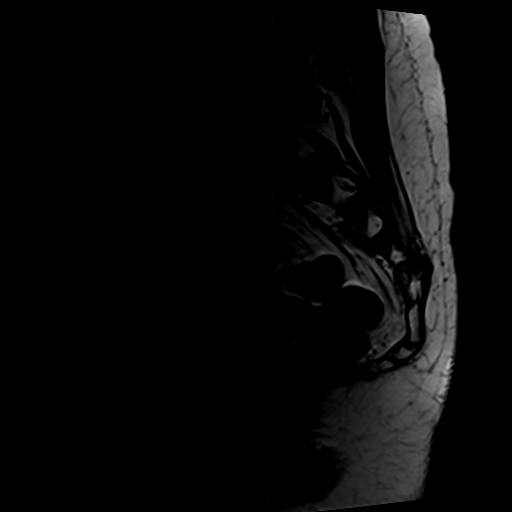
[im 21/28]
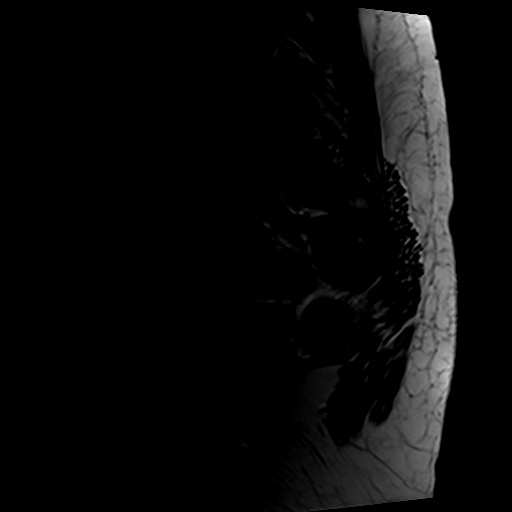
[im 24/28]
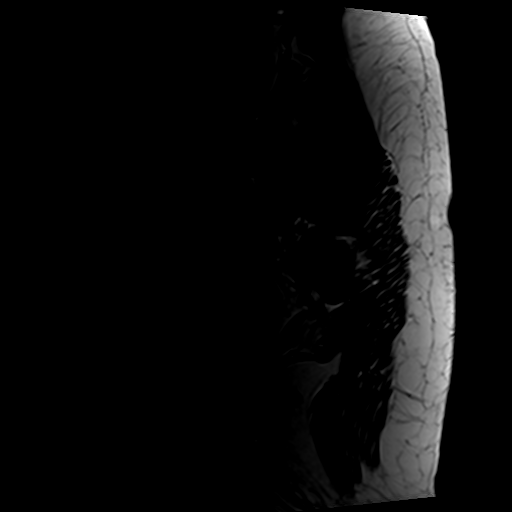
[im 28/28]
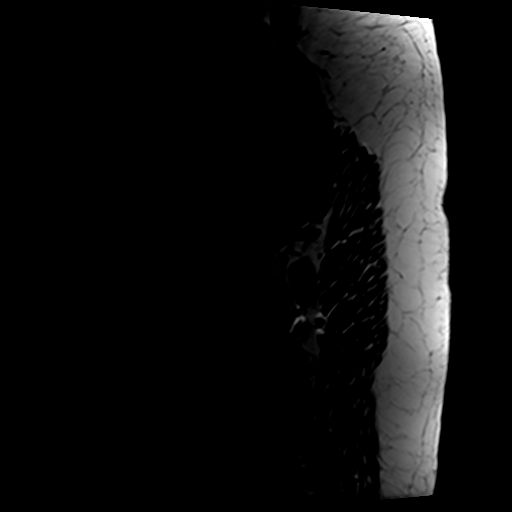

[Series 4: T1 · oblique · 4.0mm · 0.49mm/px · 4 of 22 slices shown (2 of 3)]
[im 1/22]
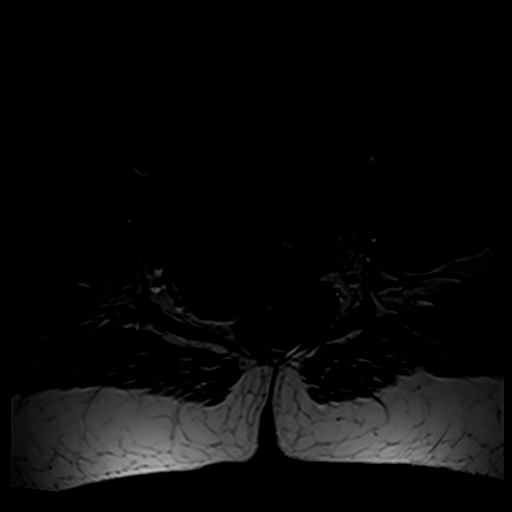
[im 4/22]
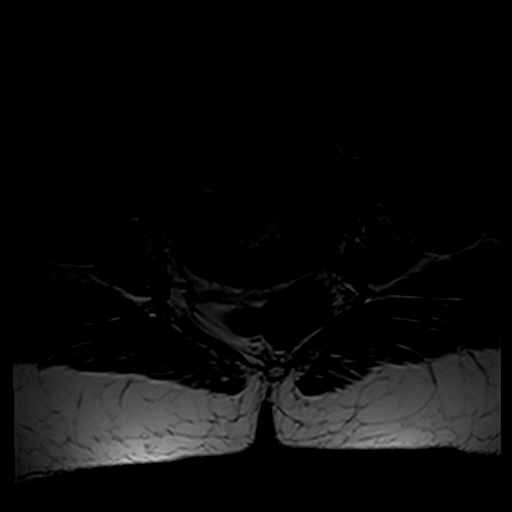
[im 11/22]
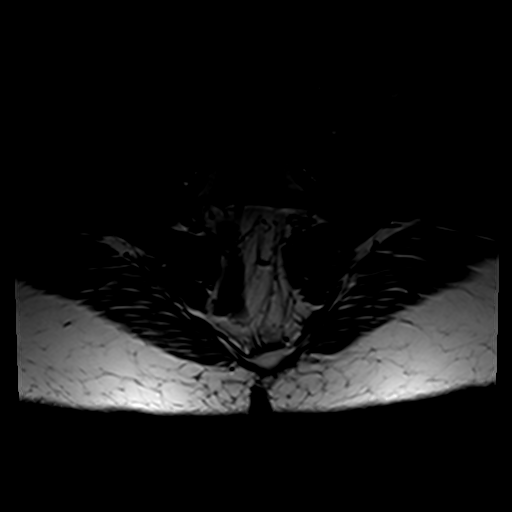
[im 18/22]
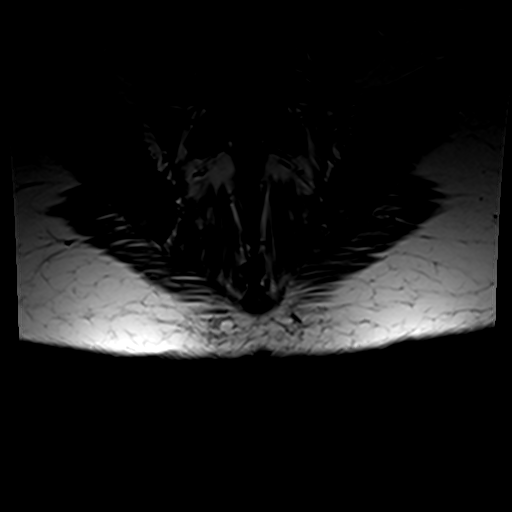

[Series 5: STIR · oblique · 4.0mm · 0.55mm/px · 3 of 22 slices shown]
[im 4/22]
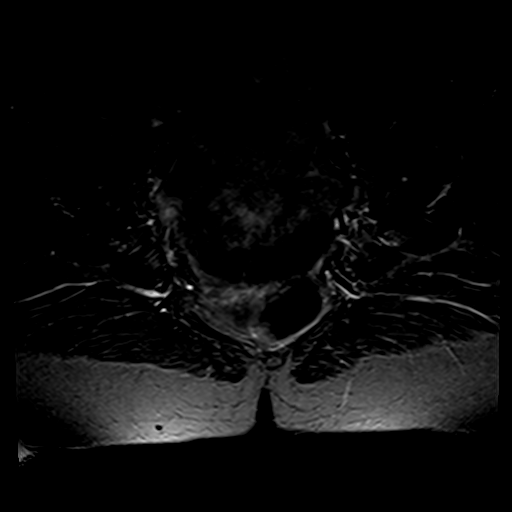
[im 13/22]
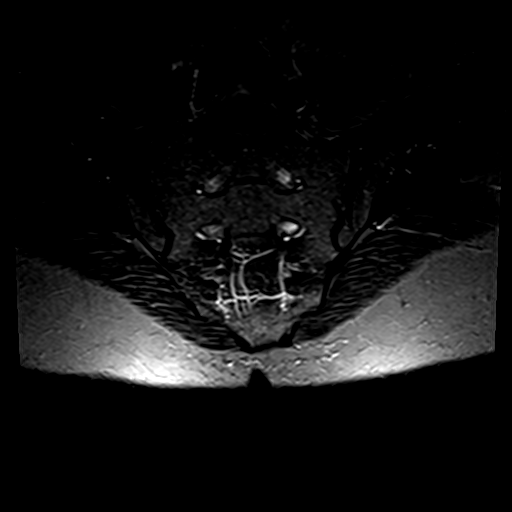
[im 19/22]
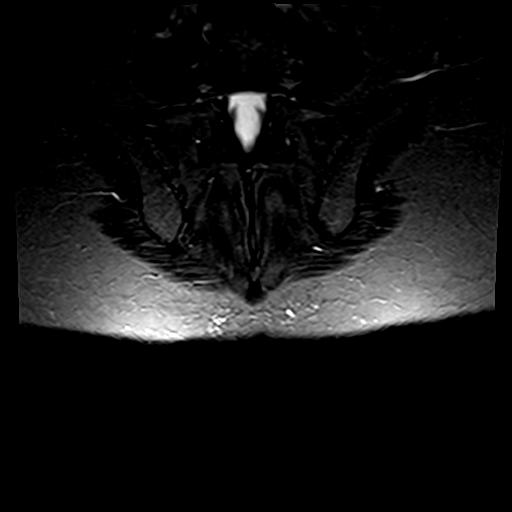

[Series 6: T1 · axial · 4.0mm · 0.39mm/px · z∈[-209,-103]mm · 3 of 34 slices shown (3 of 3)]
[im 7/34]
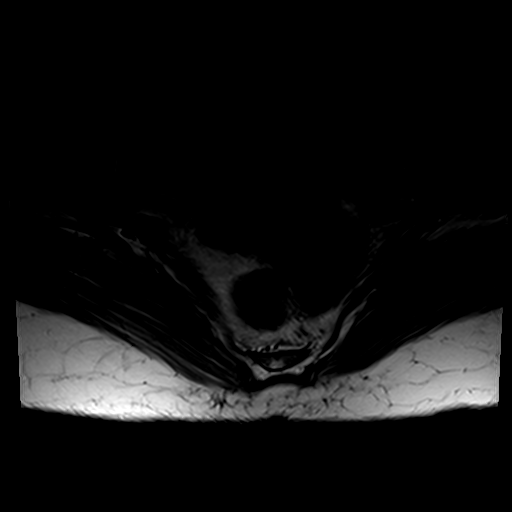
[im 19/34]
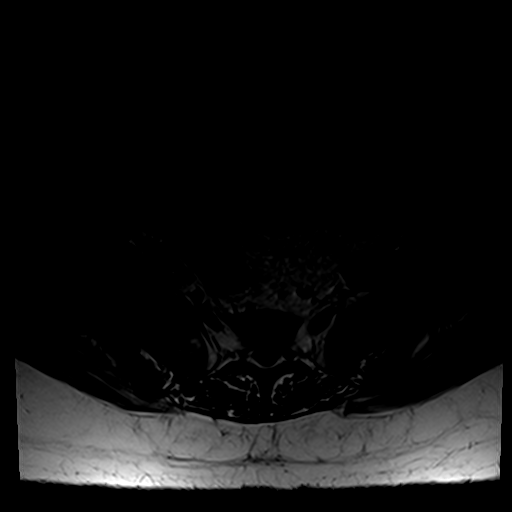
[im 31/34]
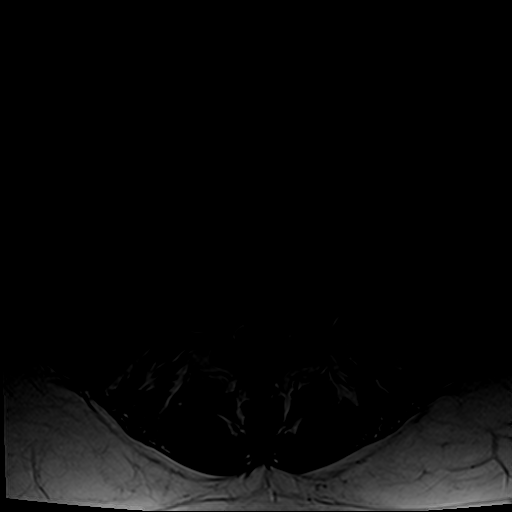

[19 of 48 positions shown; findings below may reference images not displayed]

FINDINGS: Sacroiliac joints normal. Moderate to large central disc protrusion
at L5-S1 with mild left subarticular lateral recess stenosis
affecting the left S1 nerve roots as noted on the prior lumbar spine
MRI report.

Mildly right eccentric 1.4 cm Tarlov cyst at the S3 level, probably
incidental.

No impingement along the sacral plexus, sciatic notch, or operator
region observed on either side. No presacral edema.

An IUD appears satisfactorily positioned in the uterus. Small
follicles in both ovaries. Visualize part of both femoral heads
normal. No significant free pelvic fluid.
IMPRESSION: 1. Central disc protrusion at L5-S1 further detailed in the lumbar
spine MRI report.
2. IUD appears satisfactorily positioned in the uterus.
3. The sacroiliac joints appear normal.
4. Mildly right eccentric 1.4 cm Tarlov cyst at the S3 level, likely
incidental.

## 2016-08-11 DIAGNOSIS — Z6826 Body mass index (BMI) 26.0-26.9, adult: Secondary | ICD-10-CM | POA: Diagnosis not present

## 2016-08-11 DIAGNOSIS — M5126 Other intervertebral disc displacement, lumbar region: Secondary | ICD-10-CM | POA: Diagnosis not present

## 2016-08-12 ENCOUNTER — Ambulatory Visit: Payer: BLUE CROSS/BLUE SHIELD | Admitting: Physical Therapy

## 2016-09-13 ENCOUNTER — Encounter: Payer: Self-pay | Admitting: Medical

## 2016-09-13 ENCOUNTER — Ambulatory Visit: Payer: Self-pay | Admitting: Medical

## 2016-09-13 VITALS — BP 110/70 | HR 96 | Temp 99.0°F | Resp 16 | Ht 64.0 in | Wt 150.0 lb

## 2016-09-13 DIAGNOSIS — J029 Acute pharyngitis, unspecified: Secondary | ICD-10-CM

## 2016-09-13 DIAGNOSIS — J020 Streptococcal pharyngitis: Secondary | ICD-10-CM

## 2016-09-13 LAB — POCT RAPID STREP A (OFFICE): Rapid Strep A Screen: POSITIVE — AB

## 2016-09-13 MED ORDER — AMOXICILLIN-POT CLAVULANATE 875-125 MG PO TABS: 1.0000 | ORAL_TABLET | Freq: Two times a day (BID) | ORAL | 0 refills | Status: DC

## 2016-09-13 NOTE — Progress Notes (Signed)
   Subjective:    Patient ID: Jasmin Henry, female    DOB: 09/10/1980, 36 y.o.   MRN: [id number]  HPI Started with st on Friday, increasingly worse throughout the weekend. Took dayquil , sudafed , flonase and gargled with salt water. [facility]acy is [facility] [address].    Review of Systems  Constitutional: Positive for fever.  HENT: Positive for sore throat. Negative for congestion.   Eyes: Negative.   Respiratory: Negative.   Cardiovascular: Negative.        Objective:   Physical Exam  Constitutional: She is oriented to person, place, and time. She appears well-developed and well-nourished. No distress.  HENT:  Head: Normocephalic and atraumatic.  Right Ear: External ear normal.  Left Ear: External ear normal.  Eyes: Conjunctivae and EOM are normal. Pupils are equal, round, and reactive to light.  Neck: Normal range of motion. Neck supple.  Cardiovascular: Normal rate, regular rhythm and normal heart sounds.  Exam reveals no gallop and no friction rub.   No murmur heard. Pulmonary/Chest: Effort normal and breath sounds normal.  Musculoskeletal: Normal range of motion.  Lymphadenopathy:    She has cervical adenopathy.  Neurological: She is alert and oriented to person, place, and time.  Skin: Skin is warm and dry. She is not diaphoretic.  Psychiatric: She has a normal mood and affect. Her behavior is normal. Judgment and thought content normal.          Assessment & Plan:  Step A pharyngitis Augmentin 875mg  one twice daily x 10 days take with food. Continue salt water gargles, soft food.  Return to clinic in 3-5 days if not improving.

## 2016-09-21 ENCOUNTER — Ambulatory Visit: Payer: Self-pay | Admitting: Dietician

## 2016-11-16 ENCOUNTER — Ambulatory Visit: Payer: Self-pay | Admitting: Medical

## 2016-11-16 VITALS — BP 105/78 | HR 69 | Temp 99.2°F | Resp 16 | Ht 64.0 in | Wt 158.0 lb

## 2016-11-16 DIAGNOSIS — L247 Irritant contact dermatitis due to plants, except food: Secondary | ICD-10-CM

## 2016-11-16 MED ORDER — PREDNISONE 10 MG (21) PO TBPK: ORAL_TABLET | ORAL | 0 refills | Status: DC

## 2016-11-16 NOTE — Progress Notes (Signed)
   Subjective:    Patient ID: Jasmin Henry, female    DOB: 12/04/1980, 36 y.o.   MRN: [id number]  HPI 36 yo female with rash on forearms bilaterally that began on Monday , after working in the yard on Sunday.  Now spreading to right upper medial thigh. Itchy.   Review of Systems  Eyes: Positive for itching. Negative for discharge.  Respiratory: Negative for cough.   Cardiovascular: Negative for chest pain.  Gastrointestinal: Negative for diarrhea, nausea and vomiting.  Endocrine: Negative for polyuria.  Genitourinary: Negative for hematuria.  Musculoskeletal: Negative for myalgias.  Skin: Positive for rash.  Neurological: Negative for syncope.  Hematological: Negative for adenopathy.  Psychiatric/Behavioral: Negative for confusion and hallucinations.       Objective:   Physical Exam  Constitutional: She is oriented to person, place, and time. She appears well-developed and well-nourished.  HENT:  Head: Normocephalic and atraumatic.  Eyes: EOM are normal. Pupils are equal, round, and reactive to light.  Neurological: She is alert and oriented to person, place, and time.  Skin: Rash noted. Rash is maculopapular and urticarial. There is erythema.  Psychiatric: She has a normal mood and affect. Her behavior is normal.          Assessment & Plan:  Contact Dermatitis on forearms bilaterally and on medial side of  right thigh. May use  OTC Zyrtec 10 mg during the day and OTC Benadryl 25 mg at night . Avoid heat to the areas which may increase itching. Return to clinic in 3-5 days if not improving.

## 2016-11-24 NOTE — Progress Notes (Signed)
Nutrition: Jasmin Henry, RD, LDN, 11/24/2016  CC: Seeking assistance with getting her weight down.    HX: Notes that she has gained weight over the last couple of years.  Has noticed that blood glucose is normal, but her fasting glucose has increased.  Has a positive family history for DM. Both parents have a history of cholesterol issues.  Concerned that her total cholesterol and LDL cholesterol are increased.  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  Has 2 elementary aged children with activities typical for that age.  Assessment: HT: 63.5 inches  WT: 155 lbs. No current exercise regimen.   Not eating regular meals and snacks. Breakfast: Typically will sip on coffee 6:30-11:00 am.  2-3 (10-12 oz cups) with creamer and Truvia..  Jasmin Henry have 2 boiled eggs sometimes.  Lunch: 12:00-2:00: Stir fry chicken breast + 1 rotisserie chicken breast, white rice, sweet sauce, and veggies.  Diet soda.  Snack: 6 dark chocolate Hershey's kisses.  Dinner: 5:30 pm Snack of chips and [employer] Dinner of spaghetti with tomato beef sauce.  Sometimes will do fast foods, depending on children's schedule.  Recommendations: + Make a list of favorite meals and compose a grocery list for these. +Plan to have a protein and vegetable at every meal. + Aim for 1400 calories for weight loss. +Aim for daily breakfast with protein. + Increase whole grains and fruit in diet for fiber and vitamin and mineral content. Get back to exercise either early morning or late evening F/U April in 4 weeks..  Education Materials.: Plate method handout. Food/Food Serving [employer] handout. Weight Loss Handout.  Plan/Personal Goal: + Walk 20 minutes morning or evening 5 out of 7 days. + Make out menus and grocery list. + Make Monday night dinner on Sunday. + Lose 3-4 lbs over the next 4 weeks.  Jasmin Krisna Omar, RN, RD, LDN

## 2017-01-10 ENCOUNTER — Ambulatory Visit: Payer: Self-pay | Admitting: Medical

## 2017-01-10 VITALS — BP 124/80 | HR 70 | Temp 98.8°F | Wt 156.8 lb

## 2017-01-10 DIAGNOSIS — S61211A Laceration without foreign body of left index finger without damage to nail, initial encounter: Secondary | ICD-10-CM

## 2017-01-10 NOTE — Progress Notes (Signed)
   Subjective:    Patient ID: Jasmin PonderWhitney A Henry, female    DOB: 04/24/1981, 36 y.o.   MRN: [id number]  HPI 36 yo female working in garden with son and he was using hedge clippers and got her finger cut on index finger on her left hand. Clean well at home with running it under water and rinsing with soap and applying triple antibiotic ointment to the area. Patient splinted the are with a wooden splint to help prevent finger from bending.  Tdap Dec 2016 in [facility]   Review of Systems  Constitutional: Negative for fever.  Skin: Positive for wound.  to left index finger, no drainage, no redness, mildy painful when using Ibuprofen 800 mg.      Objective:   Physical Exam  Constitutional: She is oriented to person, place, and time. She appears well-developed and well-nourished.  HENT:  Head: Normocephalic and atraumatic.  Eyes: Pupils are equal, round, and reactive to light. Conjunctivae and EOM are normal.  Neurological: She is alert and oriented to person, place, and time.  Skin: Skin is warm and dry. No erythema.  Psychiatric: She has a normal mood and affect. Her behavior is normal. Judgment and thought content normal.  Nursing note and vitals reviewed.   Left medial side of  DIP with jagged laceration, clean looking , approximated well. Limited range of motion secondary to pain.     Assessment & Plan:  Laceration to left index. To take Ibuprofen 800 mg three times a day with food as needed for pain. Explained it will also help with swelling. Had patient clean wound  today in clinic. Redressed wound with neosporin to the wound, dressed with wooden splint to prevent finger from bending, reviewed wound care with patien To clean twice daily with dilute soap and water and apply triple antibiotic oinment to the sitet . Reviewed signs and symptoms of infection pus fever , increased pain and swelling or redness. to call or come into the clinic and I  will call her/prescribe some antibiotics.  Return to the clinic as needed.

## 2017-01-10 NOTE — Patient Instructions (Signed)
Wound  Laceration Care, Adult A laceration is a cut that goes through all layers of the skin. The cut also goes into the tissue that is right under the skin. Some cuts heal on their own. Others need to be closed with stitches (sutures), staples, skin adhesive strips, or wound glue. Taking care of your cut lowers your risk of infection and helps your cut to heal better. How to take care of your cut For stitches or staples:  Keep the wound clean and dry.  If you were given a bandage (dressing), you should change it at least one time per day or as told by your doctor. You should also change it if it gets wet or dirty.  Keep the wound completely dry for the first 24 hours or as told by your doctor. After that time, you may take a shower or a bath. However, make sure that the wound is not soaked in water until after the stitches or staples have been removed.  Clean the wound one time each day or as told by your doctor: ? Wash the wound with soap and water. ? Rinse the wound with water until all of the soap comes off. ? Pat the wound dry with a clean towel. Do not rub the wound.  After you clean the wound, put a thin layer of antibiotic ointment on it as told by your doctor. This ointment: ? Helps to prevent infection. ? Keeps the bandage from sticking to the wound.  Have your stitches or staples removed as told by your doctor. If your doctor used skin adhesive strips:  Keep the wound clean and dry.  If you were given a bandage, you should change it at least one time per day or as told by your doctor. You should also change it if it gets dirty or wet.  Do not get the skin adhesive strips wet. You can take a shower or a bath, but be careful to keep the wound dry.  If the wound gets wet, pat it dry with a clean towel. Do not rub the wound.  Skin adhesive strips fall off on their own. You can trim the strips as the wound heals. Do not remove any strips that are still stuck to the wound. They  will fall off after a while. If your doctor used wound glue:  Try to keep your wound dry, but you may briefly wet it in the shower or bath. Do not soak the wound in water, such as by swimming.  After you take a shower or a bath, gently pat the wound dry with a clean towel. Do not rub the wound.  Do not do any activities that will make you really sweaty until the skin glue has fallen off on its own.  Do not apply liquid, cream, or ointment medicine to your wound while the skin glue is still on.  If you were given a bandage, you should change it at least one time per day or as told by your doctor. You should also change it if it gets dirty or wet.  If a bandage is placed over the wound, do not let the tape for the bandage touch the skin glue.  Do not pick at the glue. The skin glue usually stays on for 5-10 days. Then, it falls off of the skin. General Instructions  To help prevent scarring, make sure to cover your wound with sunscreen whenever you are outside after stitches are removed, after adhesive strips are  removed, or when wound glue stays in place and the wound is healed. Make sure to wear a sunscreen of at least 30 SPF.  Take over-the-counter and prescription medicines only as told by your doctor.  If you were given antibiotic medicine or ointment, take or apply it as told by your doctor. Do not stop using the antibiotic even if your wound is getting better.  Do not scratch or pick at the wound.  Keep all follow-up visits as told by your doctor. This is important.  Check your wound every day for signs of infection. Watch for: ? Redness, swelling, or pain. ? Fluid, blood, or pus.  Raise (elevate) the injured area above the level of your heart while you are sitting or lying down, if possible. Get help if:  You got a tetanus shot and you have any of these problems at the injection site: ? Swelling. ? Very bad pain. ? Redness. ? Bleeding.  You have a fever.  A wound that  was closed breaks open.  You notice a bad smell coming from your wound or your bandage.  You notice something coming out of the wound, such as wood or glass.  Medicine does not help your pain.  You have more redness, swelling, or pain at the site of your wound.  You have fluid, blood, or pus coming from your wound.  You notice a change in the color of your skin near your wound.  You need to change the bandage often because fluid, blood, or pus is coming from the wound.  You start to have a new rash.  You start to have numbness around the wound. Get help right away if:  You have very bad swelling around the wound.  Your pain suddenly gets worse and is very bad.  You notice painful lumps near the wound or on skin that is anywhere on your body.  You have a red streak going away from your wound.  The wound is on your hand or foot and you cannot move a finger or toe like you usually can.  The wound is on your hand or foot and you notice that your fingers or toes look pale or bluish. This information is not intended to replace advice given to you by your health care provider. Make sure you discuss any questions you have with your health care provider. Document Released: 12/01/2007 Document Revised: 11/20/2015 Document Reviewed: 06/10/2014 Elsevier Interactive Patient Education  [employer]2018 [employer]

## 2017-03-22 ENCOUNTER — Ambulatory Visit: Payer: Self-pay | Admitting: Medical

## 2017-03-22 ENCOUNTER — Encounter: Payer: Self-pay | Admitting: Medical

## 2017-03-22 VITALS — BP 120/80 | HR 82 | Temp 98.8°F | Resp 18 | Ht 64.0 in | Wt 152.0 lb

## 2017-03-22 DIAGNOSIS — R5383 Other fatigue: Secondary | ICD-10-CM

## 2017-03-22 NOTE — Progress Notes (Signed)
Subjective:    Patient ID: Jasmin Henry, female    DOB: 07-21-1980, 36 y.o.   MRN: [id number]  HPI    36 yo  Female in non acute distress feeling tired and difficulty focusing and concentrating starting about mid August. Last time when she felt this way she took Wellbutrin 2016 to 2017 seemed to help intially.  However when she restarted the medication this time she had not felt any better. Denies loss of hair or constipation.  Self treating taking Wellbutrin 75 mg takes  2 twice a day, written by Dr. Maryruth Bun takes  for "mild depression" and it seemed to have helped with fatigue " in 2016".  Started taking left over from 2017 ( has  3 months supply). Patient restarted medication in mid-August 2018. Was going to follow up with Dr Maryruth Bun if it was working , but it has not been working and she wants to rule out any other medical conditions first..   Work is stressful  Due to the time of year ( beginning of semester). Not exercising due to exhaustion and then anxiety goes up as well.  Also taking 4 gummies of Vitamin D3. Taking a total of 6000 IU / day.  Review of Systems  Constitutional: Positive for activity change, appetite change, fatigue and unexpected weight change. Negative for fever.  HENT: Negative.   Eyes: Negative.   Respiratory: Positive for chest tightness.   Cardiovascular: Negative.  Negative for chest pain.  Gastrointestinal: Negative.   Endocrine: Negative.   Genitourinary: Negative.   Musculoskeletal: Negative.   Skin: Negative.   Allergic/Immunologic: Negative.   Neurological: Negative.   Hematological: Bruises/bleeds easily.  Psychiatric/Behavioral: Positive for agitation, behavioral problems, decreased concentration and sleep disturbance. Negative for confusion, hallucinations, self-injury and suicidal ideas. The patient is nervous/anxious. The patient is not hyperactive.    Less exercise and eating more sweets gaining weight. Chest tightness with anxiety.   her   Back is doing much better Bruises easily , but nothing new. Short tempered when things are not " great". Denies tick bites.  Has left ankle wrapped but does not want it examined, sprained mild swelling , sprained on Sunday playing raquetball. She is rewrapping it daily and says it is improving. Objective:   Physical Exam  Constitutional: She is oriented to person, place, and time. She appears well-developed and well-nourished.  HENT:  Head: Normocephalic and atraumatic.  Eyes: Pupils are equal, round, and reactive to light. Conjunctivae and EOM are normal.  Neck: Normal range of motion. Neck supple.  Cardiovascular: Normal rate, regular rhythm and normal heart sounds.   Pulmonary/Chest: Effort normal and breath sounds normal.  Musculoskeletal: Normal range of motion.  Lymphadenopathy:    She has no cervical adenopathy.  Neurological: She is alert and oriented to person, place, and time.  Skin: Skin is warm and dry.  Psychiatric: She has a normal mood and affect. Her behavior is normal. Judgment and thought content normal.  Nursing note and vitals reviewed.    1/2 way into interview patient started using her phone, during history taking, saying she was suppose to be picked up at  10:30pm.    Assessment & Plan:  Fatigue. Exec panel vit D level / B12 and folate blood work ordered. Would like to stay on Wellbutrin (started it about  4 weeks ago. Patient states she has titrated up slowly) This is the first week she has been on  150 mg twice daily ,which worked last time, though she  cannot recall how long of time she took the Wellbutrin that it started to work.  She says she is going to follow up with Doctor Maryruth Bun as well. Denies wanting to hurt herself or others , denies hallucinations.Will contact patient once I get her lab results.  Verbalizes understanding and has no questions at discharge.

## 2017-03-22 NOTE — Patient Instructions (Signed)

## 2017-03-23 ENCOUNTER — Other Ambulatory Visit: Payer: Self-pay | Admitting: Medical

## 2017-03-25 ENCOUNTER — Other Ambulatory Visit: Payer: Self-pay

## 2017-03-25 DIAGNOSIS — R5383 Other fatigue: Secondary | ICD-10-CM

## 2017-03-26 LAB — CMP12+LP+TP+TSH+6AC+PSA+CBC…
A/G RATIO: 1.9 (ref 1.2–2.2)
ALK PHOS: 60 IU/L (ref 39–117)
ALT: 15 IU/L (ref 0–32)
AST: 22 IU/L (ref 0–40)
Albumin: 4.6 g/dL (ref 3.5–5.5)
BASOS ABS: 0 10*3/uL (ref 0.0–0.2)
BASOS: 1 %
BILIRUBIN TOTAL: 0.2 mg/dL (ref 0.0–1.2)
BUN/Creatinine Ratio: 13 (ref 9–23)
BUN: 10 mg/dL (ref 6–20)
CHOL/HDL RATIO: 2.7 ratio (ref 0.0–4.4)
Calcium: 8.8 mg/dL (ref 8.7–10.2)
Chloride: 103 mmol/L (ref 96–106)
Cholesterol, Total: 173 mg/dL (ref 100–199)
Creatinine, Ser: 0.76 mg/dL (ref 0.57–1.00)
EOS (ABSOLUTE): 0.1 10*3/uL (ref 0.0–0.4)
Eos: 1 %
Estimated CHD Risk: <0.5 times avg. (ref 0.0–1.0)
Free Thyroxine Index: 2 (ref 1.2–4.9)
GFR calc Af Amer: 116 mL/min/{1.73_m2} (ref >59)
GFR calc non Af Amer: 101 mL/min/{1.73_m2} (ref >59)
GGT: 20 IU/L (ref 0–60)
GLOBULIN, TOTAL: 2.4 g/dL (ref 1.5–4.5)
Glucose: 74 mg/dL (ref 65–99)
HDL: 64 mg/dL (ref >39)
Hematocrit: 39.7 % (ref 34.0–46.6)
Hemoglobin: 12.9 g/dL (ref 11.1–15.9)
IMMATURE GRANULOCYTES: 0 %
Immature Grans (Abs): 0 10*3/uL (ref 0.0–0.1)
Iron: 82 ug/dL (ref 27–159)
LDH: 249 IU/L — AB (ref 119–226)
LDL Calculated: 92 mg/dL (ref 0–99)
LYMPHS ABS: 2 10*3/uL (ref 0.7–3.1)
Lymphs: 23 %
MCH: 30.6 pg (ref 26.6–33.0)
MCHC: 32.5 g/dL (ref 31.5–35.7)
MCV: 94 fL (ref 79–97)
MONOS ABS: 0.7 10*3/uL (ref 0.1–0.9)
Monocytes: 8 %
NEUTROS PCT: 67 %
Neutrophils Absolute: 6 10*3/uL (ref 1.4–7.0)
PHOSPHORUS: 3 mg/dL (ref 2.5–4.5)
PLATELETS: 334 10*3/uL (ref 150–379)
Potassium: 4.8 mmol/L (ref 3.5–5.2)
RBC: 4.21 x10E6/uL (ref 3.77–5.28)
RDW: 13.7 % (ref 12.3–15.4)
SODIUM: 138 mmol/L (ref 134–144)
T3 UPTAKE RATIO: 28 % (ref 24–39)
T4 TOTAL: 7.2 ug/dL (ref 4.5–12.0)
TRIGLYCERIDES: 83 mg/dL (ref 0–149)
TSH: 2.3 u[IU]/mL (ref 0.450–4.500)
Total Protein: 7 g/dL (ref 6.0–8.5)
Uric Acid: 5.2 mg/dL (ref 2.5–7.1)
VLDL CHOLESTEROL CAL: 17 mg/dL (ref 5–40)
WBC: 8.7 10*3/uL (ref 3.4–10.8)

## 2017-03-26 LAB — B12 AND FOLATE PANEL
FOLATE: 12.3 ng/mL (ref >3.0)
Vitamin B-12: 223 pg/mL — ABNORMAL LOW (ref 232–1245)

## 2017-03-26 LAB — VITAMIN D 25 HYDROXY (VIT D DEFICIENCY, FRACTURES): VIT D 25 HYDROXY: 29.1 ng/mL — AB (ref 30.0–100.0)

## 2017-03-28 ENCOUNTER — Telehealth: Payer: Self-pay | Admitting: Medical

## 2017-03-28 NOTE — Telephone Encounter (Signed)
Attempted to call patient on cell, mailbox is full.  Called her work number , unavailable will try again tomorrow.

## 2017-03-29 ENCOUNTER — Telehealth: Payer: Self-pay

## 2017-04-05 NOTE — Telephone Encounter (Signed)
Spoke with pt to relay information. Pt Labs indicate low Vitamin B12 and Vitamin D. Instructed pt to take Vitamin B12-1074mcg and D3-2000IU per day. Pt verbalizes understanding. Scheduled follow up labs for January 9 at 7:45AM.

## 2017-04-05 NOTE — Telephone Encounter (Addendum)
-----   Message from Doy Mince, [place] sent at 04/05/2017  8:08 AM EDT ----- This will be third attempt at getting a hold o thisf patient. She needs to be on 1000 mcg of OTC B12 and 2000IU/ day of Vitamin D3 with a recheck of both in 3 months.( schedule appointment)

## 2017-04-11 ENCOUNTER — Telehealth: Payer: Self-pay | Admitting: Medical

## 2017-04-11 DIAGNOSIS — E559 Vitamin D deficiency, unspecified: Secondary | ICD-10-CM

## 2017-04-11 DIAGNOSIS — R7402 Elevation of levels of lactic acid dehydrogenase (LDH): Secondary | ICD-10-CM

## 2017-04-11 DIAGNOSIS — R740 Nonspecific elevation of levels of transaminase and lactic acid dehydrogenase [LDH]: Secondary | ICD-10-CM

## 2017-04-11 DIAGNOSIS — E538 Deficiency of other specified B group vitamins: Secondary | ICD-10-CM

## 2017-04-11 NOTE — Telephone Encounter (Signed)
Recheck LDH, B12 and Vitamin D in 3 months , reveiwed with Dr. Sullivan Lone. Patient labs reviewed  And treatment by RN CNovak.

## 2017-07-06 ENCOUNTER — Other Ambulatory Visit: Payer: Self-pay

## 2017-07-06 DIAGNOSIS — Z0000 Encounter for general adult medical examination without abnormal findings: Secondary | ICD-10-CM

## 2017-07-06 DIAGNOSIS — R7402 Elevation of levels of lactic acid dehydrogenase (LDH): Secondary | ICD-10-CM

## 2017-07-06 DIAGNOSIS — R740 Nonspecific elevation of levels of transaminase and lactic acid dehydrogenase [LDH]: Secondary | ICD-10-CM

## 2017-07-06 DIAGNOSIS — E538 Deficiency of other specified B group vitamins: Secondary | ICD-10-CM

## 2017-07-07 LAB — HEPATIC FUNCTION PANEL
ALT: 14 IU/L (ref 0–32)
AST: 15 IU/L (ref 0–40)
Albumin: 4.6 g/dL (ref 3.5–5.5)
Alkaline Phosphatase: 69 IU/L (ref 39–117)
BILIRUBIN TOTAL: 0.2 mg/dL (ref 0.0–1.2)
BILIRUBIN, DIRECT: 0.08 mg/dL (ref 0.00–0.40)
Total Protein: 7.5 g/dL (ref 6.0–8.5)

## 2017-07-07 LAB — LACTATE DEHYDROGENASE: LDH: 170 IU/L (ref 119–226)

## 2017-07-07 LAB — VITAMIN D 25 HYDROXY (VIT D DEFICIENCY, FRACTURES): Vit D, 25-Hydroxy: 21.9 ng/mL — ABNORMAL LOW (ref 30.0–100.0)

## 2017-07-07 LAB — B12 AND FOLATE PANEL
Folate: 11.2 ng/mL (ref >3.0)
Vitamin B-12: 224 pg/mL — ABNORMAL LOW (ref 232–1245)

## 2017-07-07 NOTE — Progress Notes (Signed)
Please Call patient and check to see if she has been taking her vitamin B12 and Vit D3 daily. Thank  you Herbert SetaHeather

## 2017-07-15 ENCOUNTER — Telehealth: Payer: Self-pay

## 2017-07-15 NOTE — Telephone Encounter (Signed)
Follow up call to patient to verify that patient is taking B12 and Vitamin D 3 as prescribed. Both levels were below normal on her lab panel.  Pt states that she is taking both as prescribed. Heather Ratcliffe PA  notifed.

## 2017-08-03 ENCOUNTER — Encounter: Payer: Self-pay | Admitting: Adult Health

## 2017-08-03 ENCOUNTER — Ambulatory Visit: Payer: Self-pay | Admitting: Adult Health

## 2017-08-03 VITALS — BP 110/70 | HR 87 | Temp 98.3°F | Resp 16 | Ht 64.0 in | Wt 163.0 lb

## 2017-08-03 DIAGNOSIS — R238 Other skin changes: Secondary | ICD-10-CM

## 2017-08-03 DIAGNOSIS — Z0000 Encounter for general adult medical examination without abnormal findings: Secondary | ICD-10-CM

## 2017-08-03 LAB — POCT URINE PREGNANCY: Preg Test, Ur: NEGATIVE

## 2017-08-03 MED ORDER — PREDNISONE 10 MG (21) PO TBPK: ORAL_TABLET | ORAL | 0 refills | Status: DC

## 2017-08-03 MED ORDER — CEPHALEXIN 500 MG PO CAPS: 500.0000 mg | ORAL_CAPSULE | Freq: Two times a day (BID) | ORAL | 0 refills | Status: DC

## 2017-08-03 NOTE — Patient Instructions (Addendum)
Loratadine tablets What is this medicine? LORATADINE (lor AT a deen) is an antihistamine. It helps to relieve sneezing, runny nose, and itchy, watery eyes. This medicine is used to treat the symptoms of allergies. It is also used to treat itchy skin rash and hives. This medicine may be used for other purposes; ask your health care provider or pharmacist if you have questions. COMMON BRAND NAME(S): Alavert, Allergy Relief, Claritin, Claritin Hives Relief, Clear-Atadine, QlearQuil All Day & All Night Allergy Relief, Tavist ND What should I tell my health care provider before I take this medicine? They need to know if you have any of these conditions: -asthma -kidney disease -liver disease -an unusual or allergic reaction to loratadine, other antihistamines, other medicines, foods, dyes, or preservatives -pregnant or trying to get pregnant -breast-feeding How should I use this medicine? Take this medicine by mouth with a glass of water. Follow the directions on the label. You may take this medicine with food or on an empty stomach. Take your medicine at regular intervals. Do not take your medicine more often than directed. Talk to your pediatrician regarding the use of this medicine in children. While this medicine may be used in children as young as 6 years for selected conditions, precautions do apply. Overdosage: If you think you have taken too much of this medicine contact a poison control center or emergency room at once. NOTE: This medicine is only for you. Do not share this medicine with others. What if I miss a dose? If you miss a dose, take it as soon as you can. If it is almost time for your next dose, take only that dose. Do not take double or extra doses. What may interact with this medicine? -other medicines for colds or allergies This list may not describe all possible interactions. Give your health care provider a list of all the medicines, herbs, non-prescription drugs, or dietary  supplements you use. Also tell them if you smoke, drink alcohol, or use illegal drugs. Some items may interact with your medicine. What should I watch for while using this medicine? Tell your doctor or healthcare professional if your symptoms do not start to get better or if they get worse. Your mouth may get dry. Chewing sugarless gum or sucking hard candy, and drinking plenty of water may help. Contact your doctor if the problem does not go away or is severe. You may get drowsy or dizzy. Do not drive, use machinery, or do anything that needs mental alertness until you know how this medicine affects you. Do not stand or sit up quickly, especially if you are an older patient. This reduces the risk of dizzy or fainting spells. What side effects may I notice from receiving this medicine? Side effects that you should report to your doctor or health care professional as soon as possible: -allergic reactions like skin rash, itching or hives, swelling of the face, lips, or tongue -breathing problems -unusually restless or nervous Side effects that usually do not require medical attention (report to your doctor or health care professional if they continue or are bothersome): -drowsiness -dry or irritated mouth or throat -headache This list may not describe all possible side effects. Call your doctor for medical advice about side effects. You may report side effects to FDA at 1-800-FDA-1088. Where should I keep my medicine? Keep out of the reach of children. Store at room temperature between 2 and 30 degrees C (36 and 86 degrees F). Protect from moisture. Throw away any  unused medicine after the expiration date. NOTE: This sheet is a summary. It may not cover all possible information. If you have questions about this medicine, talk to your doctor, pharmacist, or health care provider.  2018 Elsevier/Gold Standard (2007-12-18 17:17:24) Prednisone tablets What is this medicine? PREDNISONE (PRED ni sone)  is a corticosteroid. It is commonly used to treat inflammation of the skin, joints, lungs, and other organs. Common conditions treated include asthma, allergies, and arthritis. It is also used for other conditions, such as blood disorders and diseases of the adrenal glands. This medicine may be used for other purposes; ask your health care provider or pharmacist if you have questions. COMMON BRAND NAME(S): Deltasone, Predone, Sterapred, Sterapred DS What should I tell my health care provider before I take this medicine? They need to know if you have any of these conditions: -Cushing's syndrome -diabetes -glaucoma -heart disease -high blood pressure -infection (especially a virus infection such as chickenpox, cold sores, or herpes) -kidney disease -liver disease -mental illness -myasthenia gravis -osteoporosis -seizures -stomach or intestine problems -thyroid disease -an unusual or allergic reaction to lactose, prednisone, other medicines, foods, dyes, or preservatives -pregnant or trying to get pregnant -breast-feeding How should I use this medicine? Take this medicine by mouth with a glass of water. Follow the directions on the prescription label. Take this medicine with food. If you are taking this medicine once a day, take it in the morning. Do not take more medicine than you are told to take. Do not suddenly stop taking your medicine because you may develop a severe reaction. Your doctor will tell you how much medicine to take. If your doctor wants you to stop the medicine, the dose may be slowly lowered over time to avoid any side effects. Talk to your pediatrician regarding the use of this medicine in children. Special care may be needed. Overdosage: If you think you have taken too much of this medicine contact a poison control center or emergency room at once. NOTE: This medicine is only for you. Do not share this medicine with others. What if I miss a dose? If you miss a dose,  take it as soon as you can. If it is almost time for your next dose, talk to your doctor or health care professional. You may need to miss a dose or take an extra dose. Do not take double or extra doses without advice. What may interact with this medicine? Do not take this medicine with any of the following medications: -metyrapone -mifepristone This medicine may also interact with the following medications: -aminoglutethimide -amphotericin B -aspirin and aspirin-like medicines -barbiturates -certain medicines for diabetes, like glipizide or glyburide -cholestyramine -cholinesterase inhibitors -cyclosporine -digoxin -diuretics -ephedrine -female hormones, like estrogens and birth control pills -isoniazid -ketoconazole -NSAIDS, medicines for pain and inflammation, like ibuprofen or naproxen -phenytoin -rifampin -toxoids -vaccines -warfarin This list may not describe all possible interactions. Give your health care provider a list of all the medicines, herbs, non-prescription drugs, or dietary supplements you use. Also tell them if you smoke, drink alcohol, or use illegal drugs. Some items may interact with your medicine. What should I watch for while using this medicine? Visit your doctor or health care professional for regular checks on your progress. If you are taking this medicine over a prolonged period, carry an identification card with your name and address, the type and dose of your medicine, and your doctor's name and address. This medicine may increase your risk of getting an infection. Tell  your doctor or health care professional if you are around anyone with measles or chickenpox, or if you develop sores or blisters that do not heal properly. If you are going to have surgery, tell your doctor or health care professional that you have taken this medicine within the last twelve months. Ask your doctor or health care professional about your diet. You may need to lower the amount  of salt you eat. This medicine may affect blood sugar levels. If you have diabetes, check with your doctor or health care professional before you change your diet or the dose of your diabetic medicine. What side effects may I notice from receiving this medicine? Side effects that you should report to your doctor or health care professional as soon as possible: -allergic reactions like skin rash, itching or hives, swelling of the face, lips, or tongue -changes in emotions or moods -changes in vision -depressed mood -eye pain -fever or chills, cough, sore throat, pain or difficulty passing urine -increased thirst -swelling of ankles, feet Side effects that usually do not require medical attention (report to your doctor or health care professional if they continue or are bothersome): -confusion, excitement, restlessness -headache -nausea, vomiting -skin problems, acne, thin and shiny skin -trouble sleeping -weight gain This list may not describe all possible side effects. Call your doctor for medical advice about side effects. You may report side effects to FDA at 1-800-FDA-1088. Where should I keep my medicine? Keep out of the reach of children. Store at room temperature between 15 and 30 degrees C (59 and 86 degrees F). Protect from light. Keep container tightly closed. Throw away any unused medicine after the expiration date. NOTE: This sheet is a summary. It may not cover all possible information. If you have questions about this medicine, talk to your doctor, pharmacist, or health care provider.  2018 Elsevier/Gold Standard (2011-01-28 10:57:14) Cephalexin tablets or capsules What is this medicine? CEPHALEXIN (sef a LEX in) is a cephalosporin antibiotic. It is used to treat certain kinds of bacterial infections It will not work for colds, flu, or other viral infections. This medicine may be used for other purposes; ask your health care provider or pharmacist if you have  questions. COMMON BRAND NAME(S): Biocef, Daxbia, Keflex, Keftab What should I tell my health care provider before I take this medicine? They need to know if you have any of these conditions: -kidney disease -stomach or intestine problems, especially colitis -an unusual or allergic reaction to cephalexin, other cephalosporins, penicillins, other antibiotics, medicines, foods, dyes or preservatives -pregnant or trying to get pregnant -breast-feeding How should I use this medicine? Take this medicine by mouth with a full glass of water. Follow the directions on the prescription label. This medicine can be taken with or without food. Take your medicine at regular intervals. Do not take your medicine more often than directed. Take all of your medicine as directed even if you think you are better. Do not skip doses or stop your medicine early. Talk to your pediatrician regarding the use of this medicine in children. While this drug may be prescribed for selected conditions, precautions do apply. Overdosage: If you think you have taken too much of this medicine contact a poison control center or emergency room at once. NOTE: This medicine is only for you. Do not share this medicine with others. What if I miss a dose? If you miss a dose, take it as soon as you can. If it is almost time for your next  dose, take only that dose. Do not take double or extra doses. There should be at least 4 to 6 hours between doses. What may interact with this medicine? -probenecid -some other antibiotics This list may not describe all possible interactions. Give your health care provider a list of all the medicines, herbs, non-prescription drugs, or dietary supplements you use. Also tell them if you smoke, drink alcohol, or use illegal drugs. Some items may interact with your medicine. What should I watch for while using this medicine? Tell your doctor or health care professional if your symptoms do not begin to improve in  a few days. Do not treat diarrhea with over the counter products. Contact your doctor if you have diarrhea that lasts more than 2 days or if it is severe and watery. If you have diabetes, you may get a false-positive result for sugar in your urine. Check with your doctor or health care professional. What side effects may I notice from receiving this medicine? Side effects that you should report to your doctor or health care professional as soon as possible: -allergic reactions like skin rash, itching or hives, swelling of the face, lips, or tongue -breathing problems -pain or trouble passing urine -redness, blistering, peeling or loosening of the skin, including inside the mouth -severe or watery diarrhea -unusually weak or tired -yellowing of the eyes, skin Side effects that usually do not require medical attention (report to your doctor or health care professional if they continue or are bothersome): -gas or heartburn -genital or anal irritation -headache -joint or muscle pain -nausea, vomiting This list may not describe all possible side effects. Call your doctor for medical advice about side effects. You may report side effects to FDA at 1-800-FDA-1088. Where should I keep my medicine? Keep out of the reach of children. Store at room temperature between 59 and 86 degrees F (15 and 30 degrees C). Throw away any unused medicine after the expiration date. NOTE: This sheet is a summary. It may not cover all possible information. If you have questions about this medicine, talk to your doctor, pharmacist, or health care provider.  2018 Elsevier/Gold Standard (2007-09-18 17:09:13) Cellulitis, Adult Cellulitis is a skin infection. The infected area is usually red and sore. This condition occurs most often in the arms and lower legs. It is very important to get treated for this condition. Follow these instructions at home:  Take over-the-counter and prescription medicines only as told by your  doctor.  If you were prescribed an antibiotic medicine, take it as told by your doctor. Do not stop taking the antibiotic even if you start to feel better.  Drink enough fluid to keep your pee (urine) clear or pale yellow.  Do not touch or rub the infected area.  Raise (elevate) the infected area above the level of your heart while you are sitting or lying down.  Place warm or cold wet cloths (warm or cold compresses) on the infected area. Do this as told by your doctor.  Keep all follow-up visits as told by your doctor. This is important. These visits let your doctor make sure your infection is not getting worse. Contact a doctor if:  You have a fever.  Your symptoms do not get better after 1-2 days of treatment.  Your bone or joint under the infected area starts to hurt after the skin has healed.  Your infection comes back. This can happen in the same area or another area.  You have a swollen bump  in the infected area.  You have new symptoms.  You feel ill and also have muscle aches and pains. Get help right away if:  Your symptoms get worse.  You feel very sleepy.  You throw up (vomit) or have watery poop (diarrhea) for a long time.  There are red streaks coming from the infected area.  Your red area gets larger.  Your red area turns darker. This information is not intended to replace advice given to you by your health care provider. Make sure you discuss any questions you have with your health care provider. Document Released: 12/01/2007 Document Revised: 11/20/2015 Document Reviewed: 04/23/2015 Elsevier Interactive Patient Education  2018 [employer].

## 2017-08-03 NOTE — Progress Notes (Signed)
Subjective:     Patient ID: Jasmin Henry, female   DOB: 11/02/1980, 37 y.o.   MRN: [id number]  HPI   Patient is a 37 year old female in no acute distress with pink areas and mild redness around her bilateral eyes. She reports it was worse this morning and she used benadryl cream, oral Benadryl, cool compresses helped some. These areas feel ": burring". Denies any weeping, blisters or drainage.  Denies any pain. She denies any eye pain, eye redness, eye discharge or any vision changes.  Denies any new products or creams for face.  She is using Benzyl peroxide from nose down now after it burned when she used around her eyes over a week ago before this area occurred. She reports she has used this for a long while now without previous irritation.   She denies any history of herpes zoster in the past. Denies any other rash or any drainage, blisters or weeping.   She denies any photophobia.  She does wear contacts bilateral eyes denies any difficulty or irritation with these and denies any vision changes.  Allergies  Allergen Reactions  . Sulfa Antibiotics Nausea And Vomiting   She reports pink eye is going around her office but denies any discharge from eyes or eyes redness.  Denies any pain. She denies any injury.   Patient  denies any fever, chills, rash, chest pain, shortness of breath, nausea, vomiting, or diarrhea.  No LMP recorded. Patient is not currently having periods (Reason: IUD).- Denies chance.    Current Outpatient Medications:  .  buPROPion (WELLBUTRIN) 75 MG tablet, Take 75 mg by mouth 2 (two) times daily., Disp: , Rfl:  .  cholecalciferol (VITAMIN D) 1000 units tablet, Take 1,000 Units by mouth daily. Take 4 gummies daily, Disp: , Rfl:  .  fluticasone (FLONASE) 50 MCG/ACT nasal spray, Place 1 spray into both nostrils daily., Disp: , Rfl:  .  levonorgestrel (MIRENA) 20 MCG/24HR IUD, 1 each by Intrauterine route once., Disp: , Rfl:    Review of Systems  Constitutional:  Negative.   Eyes: Negative for photophobia, pain, discharge, redness, itching and visual disturbance.  Respiratory: Negative.   Cardiovascular: Negative.   Gastrointestinal: Negative.   Genitourinary: Negative.   Musculoskeletal: Negative.   Skin: Positive for color change (around outside of bilateral lateral eyes skin only  ). Negative for pallor, rash and wound.  Allergic/Immunologic:       Sulfa antibiotics   Neurological: Negative.   Hematological: Negative.   Psychiatric/Behavioral: Negative.        Objective:   Physical Exam  Constitutional: She is oriented to person, place, and time. Vital signs are normal. She appears well-developed and well-nourished. She is active.  Non-toxic appearance. She does not have a sickly appearance. She does not appear ill. No distress.  HENT:  Head: Normocephalic and atraumatic.  Right Ear: Hearing, tympanic membrane, external ear and ear canal normal.  Left Ear: Hearing, tympanic membrane, external ear and ear canal normal.  Nose: Nose normal. No mucosal edema or rhinorrhea. Right sinus exhibits no maxillary sinus tenderness and no frontal sinus tenderness. Left sinus exhibits no maxillary sinus tenderness and no frontal sinus tenderness.  Mouth/Throat: Uvula is midline, oropharynx is clear and moist and mucous membranes are normal. No uvula swelling. No oropharyngeal exudate, posterior oropharyngeal edema, posterior oropharyngeal erythema or tonsillar abscesses.  Eyes: Conjunctivae, EOM and lids are normal. Pupils are equal, round, and reactive to light. Lids are everted and swept, no foreign  bodies found. Right eye exhibits no discharge. Left eye exhibits no discharge. No scleral icterus.    Area of bilateral lateral skin out side of eyes documented on diagram- see skin for further documentation.   No eye involvement bilaterally.   Neck: Normal range of motion. Neck supple. No JVD present. No tracheal deviation present.  Cardiovascular: Normal  rate, regular rhythm, normal heart sounds and intact distal pulses. Exam reveals no gallop and no friction rub.  No murmur heard. Pulmonary/Chest: Effort normal and breath sounds normal. No stridor. No respiratory distress. She has no wheezes. She has no rales. She exhibits no tenderness.  Abdominal: Soft. Bowel sounds are normal.  Musculoskeletal: Normal range of motion.  Lymphadenopathy:    She has no cervical adenopathy.  Neurological: She is alert and oriented to person, place, and time. She displays normal reflexes. No cranial nerve deficit. She exhibits normal muscle tone. Coordination normal.  Skin: Skin is warm, dry and intact. No rash noted. She is not diaphoretic. No erythema. No pallor.     See eye diagram for areas of skin irritated drawn in blue.   Lateral skin to bilateral eyes with mild erythema scattered areas of pink discoloration. Skin in these areas is dry, flaking. No blisters, lesions, drainage or warmth.     Psychiatric: She has a normal mood and affect. Her behavior is normal. Judgment and thought content normal.  Vitals reviewed.      Assessment:     Routine check-up - Plan: POCT urine pregnancy  Skin irritation  Discussed likely burn and increased irritation/ dryness   of skin from medicated face wash and to discontinue use around eyes, not intended for this area also discontinue use and contact provider who prescribes if any other irritation from face wash.   Also discussed other differentials not limited to cellulitis, infection, allergy response/ irritation and or allergic response.      Plan:     Meds ordered this encounter  Medications  . predniSONE (STERAPRED UNI-PAK 21 TAB) 10 MG (21) TBPK tablet    Sig: By mouth Take 6 tablets on day 1, Take 5 tablets day 2 Take 4 tablets day 3 Take 3 tablets day 4 Take 2 tablets day five 5 Take 1 tablet day    Dispense:  21 tablet    Refill:  0  . cephALEXin (KEFLEX) 500 MG capsule    Sig: Take 1 capsule  (500 mg total) by mouth 2 (two) times daily.    Dispense:  20 capsule    Refill:  0   Will add Claritin  Use moisturizer such as Aquaphor on skin. Do not get in eye.   Will treat for possible mild cellulitis and give prednisone for allergic component.   Patient is aware that if any signs or symptoms worsen or if any pain or eye involvement occur that she will seek treatment immediately if after hours urgent care or emergency room.  She may call for an appointment for a recheck.  If this office is open she may call patient is also aware of emergent symptoms that she needs to be seen immediately for her eye doctor or emergency room if develops.  Discussed signs and symptoms of infection with patient.  She has no eye involvement currently.  If she has vision changes she will also go to the emergency room.  She will return for recheck on Monday 07/08/17 and be seen over the weekend as above if needed.   Provider thoroughly discussed  in collaboration above plan with supervising physician Dr. Julieanne Manson who is in agreement with the care plan as above.

## 2017-08-07 DIAGNOSIS — E559 Vitamin D deficiency, unspecified: Secondary | ICD-10-CM | POA: Diagnosis not present

## 2017-08-07 DIAGNOSIS — D519 Vitamin B12 deficiency anemia, unspecified: Secondary | ICD-10-CM | POA: Diagnosis not present

## 2017-08-07 DIAGNOSIS — F331 Major depressive disorder, recurrent, moderate: Secondary | ICD-10-CM | POA: Diagnosis not present

## 2017-08-10 ENCOUNTER — Ambulatory Visit: Payer: Self-pay

## 2017-08-15 ENCOUNTER — Ambulatory Visit: Payer: Self-pay | Admitting: Medical

## 2017-08-15 VITALS — BP 110/58 | HR 100 | Temp 98.6°F | Resp 16 | Wt 166.0 lb

## 2017-08-15 DIAGNOSIS — E559 Vitamin D deficiency, unspecified: Secondary | ICD-10-CM

## 2017-08-15 DIAGNOSIS — E538 Deficiency of other specified B group vitamins: Secondary | ICD-10-CM

## 2017-08-15 DIAGNOSIS — Z79899 Other long term (current) drug therapy: Secondary | ICD-10-CM

## 2017-08-15 NOTE — Progress Notes (Signed)
   Subjective:    Patient ID: Jasmin Henry, female    DOB: 06/17/1981, 37 y.o.   MRN: [id number]  HPI 37 yo in non acuate distress here for discussion of  B12, written by Dr. Maryruth BunKapur her psychiatrist.  He prescribed  B12 2500 mcg oral and B12  Injectable 1000mg  /ml x once a week x 4 weeks then he willl reheck. And increased her  Vitamin D to  5000 U/ day plus she has 800 IU in her daily vitamincs..   Review of Systems  Constitutional: Positive for fatigue. Negative for chills and fever.  HENT: Negative for congestion, ear pain and sore throat.   Respiratory: Negative for cough.   Cardiovascular: Negative for chest pain.  Gastrointestinal: Negative for abdominal pain.  Skin: Negative for rash.  Neurological: Negative for dizziness, syncope and light-headedness.  Hematological: Negative for adenopathy.  Psychiatric/Behavioral: Negative for self-injury and suicidal ideas. The patient is not nervous/anxious.        Objective:   Physical Exam  Constitutional: She is oriented to person, place, and time. She appears well-developed and well-nourished.  Cardiovascular: Normal rate, regular rhythm and normal heart sounds.  Pulmonary/Chest: Effort normal and breath sounds normal.  Neurological: She is alert and oriented to person, place, and time.  Skin: Skin is warm and dry.  Psychiatric: She has a normal mood and affect. Her behavior is normal. Judgment and thought content normal.  Nursing note and vitals reviewed.         Assessment & Plan:  B12  Deficiency Vitamin D deficiency Medication Management , reviewed plan with Dr. Sullivan LoneGilbert. He is in agreement with current  Plan. Patient will schedule injection appointment. Return to the clinic as needed.  Patient verbalizes understanding and has no questions at discharge.

## 2017-08-19 ENCOUNTER — Ambulatory Visit: Payer: Self-pay

## 2017-08-19 DIAGNOSIS — E538 Deficiency of other specified B group vitamins: Secondary | ICD-10-CM

## 2017-08-19 MED ORDER — CYANOCOBALAMIN 1000 MCG/ML IJ SOLN
1000.0000 ug | Freq: Once | INTRAMUSCULAR | Status: AC
Start: 2017-08-19 — End: 2017-08-19
  Administered 2017-08-19: 1000 ug via INTRAMUSCULAR

## 2017-08-26 ENCOUNTER — Ambulatory Visit: Payer: Self-pay

## 2017-08-26 DIAGNOSIS — E538 Deficiency of other specified B group vitamins: Secondary | ICD-10-CM

## 2017-08-26 MED ORDER — CYANOCOBALAMIN 1000 MCG/ML IJ SOLN
1000.0000 ug | Freq: Once | INTRAMUSCULAR | Status: AC
  Administered 2017-08-26: 1000 ug via INTRAMUSCULAR

## 2017-09-02 ENCOUNTER — Ambulatory Visit: Payer: Self-pay | Admitting: *Deleted

## 2017-09-02 DIAGNOSIS — E538 Deficiency of other specified B group vitamins: Secondary | ICD-10-CM

## 2017-09-02 MED ORDER — CYANOCOBALAMIN 1000 MCG/ML IJ SOLN
1000.0000 ug | Freq: Once | INTRAMUSCULAR | Status: AC
  Administered 2017-09-02: 1000 ug via INTRAMUSCULAR

## 2017-09-08 ENCOUNTER — Ambulatory Visit: Payer: Self-pay | Admitting: Adult Health

## 2017-09-09 ENCOUNTER — Ambulatory Visit: Payer: Self-pay

## 2017-09-09 DIAGNOSIS — E538 Deficiency of other specified B group vitamins: Secondary | ICD-10-CM

## 2017-09-09 MED ORDER — CYANOCOBALAMIN 1000 MCG/ML IJ SOLN
1000.0000 ug | Freq: Once | INTRAMUSCULAR | Status: AC
Start: 2017-09-09 — End: 2017-09-09
  Administered 2017-09-09: 1000 ug via INTRAMUSCULAR

## 2017-09-22 DIAGNOSIS — D519 Vitamin B12 deficiency anemia, unspecified: Secondary | ICD-10-CM | POA: Diagnosis not present

## 2017-09-22 DIAGNOSIS — F331 Major depressive disorder, recurrent, moderate: Secondary | ICD-10-CM | POA: Diagnosis not present

## 2017-09-22 DIAGNOSIS — E559 Vitamin D deficiency, unspecified: Secondary | ICD-10-CM | POA: Diagnosis not present

## 2017-10-10 ENCOUNTER — Ambulatory Visit: Payer: Self-pay | Admitting: Medical

## 2017-10-10 VITALS — BP 137/69 | HR 85 | Temp 98.1°F | Resp 16 | Ht 64.0 in | Wt 164.0 lb

## 2017-10-10 DIAGNOSIS — L709 Acne, unspecified: Secondary | ICD-10-CM

## 2017-10-10 MED ORDER — DOXYCYCLINE HYCLATE 100 MG PO TABS: 100.0000 mg | ORAL_TABLET | Freq: Two times a day (BID) | ORAL | 0 refills | Status: DC

## 2017-10-10 NOTE — Patient Instructions (Signed)
Follow up with your Dermatologist as scheduled.    Acne Acne is a skin problem that causes small, red bumps (pimples). Acne happens when the tiny holes in your skin (pores) get blocked. Your pores may become red, sore, and swollen. They may also become infected. Acne is a common skin problem. It is especially common in teenagers. Acne usually goes away over time. Follow these instructions at home: Good skin care is the most important thing you can do to treat your acne. Take care of your skin as told by your doctor. You may be told to do these things:  Wash your skin gently at least two times each day. You should also wash your skin: ? After you exercise. ? Before you go to bed.  Use mild soap.  Use a water-based skin moisturizer after you wash your skin.  Use a sunscreen or sunblock with SPF 30 or greater. This is very important if you are using acne medicines.  Choose cosmetics that will not plug your oil glands (are noncomedogenic).  Medicines  Take over-the-counter and prescription medicines only as told by your doctor.  If you were prescribed an antibiotic medicine, apply or take it as told by your doctor. Do not stop using the antibiotic even if your acne improves. General instructions  Keep your hair clean and off of your face. Shampoo your hair regularly. If you have oily hair, you may need to wash it every day.  Avoid leaning your chin or forehead on your hands.  Avoid wearing tight headbands or hats.  Avoid picking or squeezing your pimples. That can make your acne worse and cause scarring.  Keep all follow-up visits as told by your doctor. This is important.  Shave gently. Only shave when it is necessary.  Keep a food journal. This can help you to see if any foods are linked with your acne. Contact a doctor if:  Your acne is not better after eight weeks.  Your acne gets worse.  You have a large area of skin that is red or tender.  You think that you are  having side effects from any acne medicine. This information is not intended to replace advice given to you by your health care provider. Make sure you discuss any questions you have with your health care provider. Document Released: 06/03/2011 Document Revised: 11/20/2015 Document Reviewed: 08/21/2014 Elsevier Interactive Patient Education  [employer]2018 Elsevier Inc.

## 2017-10-10 NOTE — Progress Notes (Signed)
   Subjective:    Patient ID: Jasmin Henry, female    DOB: 12/13/1980, 37 y.o.   MRN: [id number]  HPI  [age] yo female in non acute distress. Started last month with " cystic acne"  Painful  At times  2/10 now. Seems to come on with her period even though she is on the Mirena.  Dermatology appointment in two months with [facility]. Used Benzoyl peroxide but it seemed to chemically burn to the right side of face corner of right lower lip. Patient now using triamcinolone ointment to the area, it is now improving..   Also bought a miro needling roller online and though that may also help her skin.  Was taking Zyrtec 10 mg twice a day for a few days, she now is back to one tablet once a day. Also was taking Benadryl at night all for seasonal allergies.   Blood pressure 137/69, pulse 85, temperature 98.1 F (36.7 C), temperature source Tympanic, resp. rate 16, height 5\' 4"  (1.626 m), weight 164 lb (74.4 kg), SpO2 100 %.  Review of Systems  Constitutional: Negative for chills and fever.  HENT: Negative for congestion and sore throat.   Respiratory: Negative for cough.   Cardiovascular: Negative for chest pain.  Gastrointestinal: Negative for abdominal pain.  Genitourinary: Negative for dysuria.  Skin: Positive for color change (red acne on both cheeks  5-6 spots.). Negative for rash.   Taking zyrtec and flonase for allergies, doing well with management.    Objective:   Physical Exam  Constitutional: She is oriented to person, place, and time. She appears well-developed and well-nourished.  HENT:  Head: Normocephalic.  Eyes: Pupils are equal, round, and reactive to light. Conjunctivae and EOM are normal.  Neck: Normal range of motion. Neck supple.  Cardiovascular: Normal rate, regular rhythm and normal heart sounds.  Pulmonary/Chest: Effort normal and breath sounds normal.  Lymphadenopathy:    She has no cervical adenopathy.  Neurological: She is alert and oriented to  person, place, and time.  Skin: Skin is warm and dry. There is erythema (at acne areas on face.).  Psychiatric: She has a normal mood and affect. Her behavior is normal. Judgment and thought content normal.  Nursing note and vitals reviewed.     Maculopapular erythema acne on cheeks.about  5- 6 areas noted.    Assessment & Plan:  Acne on cheeks Cautioned patient about sun exposure on antibiotic. Wears a sunscreen daily SPF 30. Meds ordered this encounter  Medications  . doxycycline (VIBRA-TABS) 100 MG tablet    Sig: Take 1 tablet (100 mg total) by mouth 2 (two) times daily.    Dispense:  20 tablet    Refill:  0  reviewed with patient if doing better can go down to one pill / day of the doxy.  Reviewed with the patient importance of using medication properly and Zyrtec is a one pill a day medication. Avoid micro needling it may make face worse.  Return to the clinic as needed.  Follow up with your dermatologist as scheduled.

## 2017-11-03 DIAGNOSIS — L700 Acne vulgaris: Secondary | ICD-10-CM | POA: Diagnosis not present

## 2017-12-05 ENCOUNTER — Other Ambulatory Visit: Payer: Self-pay | Admitting: Medical

## 2017-12-05 DIAGNOSIS — E559 Vitamin D deficiency, unspecified: Secondary | ICD-10-CM

## 2017-12-05 DIAGNOSIS — Z0000 Encounter for general adult medical examination without abnormal findings: Secondary | ICD-10-CM

## 2017-12-07 ENCOUNTER — Other Ambulatory Visit: Payer: Self-pay

## 2017-12-07 DIAGNOSIS — Z0000 Encounter for general adult medical examination without abnormal findings: Secondary | ICD-10-CM

## 2017-12-07 DIAGNOSIS — E559 Vitamin D deficiency, unspecified: Secondary | ICD-10-CM

## 2017-12-08 LAB — CMP12+LP+TP+TSH+6AC+CBC/D/PLT
ALT: 20 IU/L (ref 0–32)
AST: 21 IU/L (ref 0–40)
Albumin/Globulin Ratio: 1.6 (ref 1.2–2.2)
Albumin: 4.8 g/dL (ref 3.5–5.5)
Alkaline Phosphatase: 76 IU/L (ref 39–117)
BASOS ABS: 0.1 10*3/uL (ref 0.0–0.2)
BASOS: 1 %
BILIRUBIN TOTAL: 0.4 mg/dL (ref 0.0–1.2)
BUN / CREAT RATIO: 12 (ref 9–23)
BUN: 11 mg/dL (ref 6–20)
CHLORIDE: 101 mmol/L (ref 96–106)
CHOL/HDL RATIO: 2.8 ratio (ref 0.0–4.4)
CREATININE: 0.95 mg/dL (ref 0.57–1.00)
Calcium: 9.8 mg/dL (ref 8.7–10.2)
Cholesterol, Total: 207 mg/dL — ABNORMAL HIGH (ref 100–199)
EOS (ABSOLUTE): 0.1 10*3/uL (ref 0.0–0.4)
EOS: 1 %
Free Thyroxine Index: 2.1 (ref 1.2–4.9)
GFR, EST AFRICAN AMERICAN: 88 mL/min/{1.73_m2} (ref >59)
GFR, EST NON AFRICAN AMERICAN: 76 mL/min/{1.73_m2} (ref >59)
GGT: 20 IU/L (ref 0–60)
GLUCOSE: 73 mg/dL (ref 65–99)
Globulin, Total: 3 g/dL (ref 1.5–4.5)
HDL: 74 mg/dL (ref >39)
HEMATOCRIT: 44 % (ref 34.0–46.6)
HEMOGLOBIN: 14.4 g/dL (ref 11.1–15.9)
IRON: 112 ug/dL (ref 27–159)
Immature Grans (Abs): 0 10*3/uL (ref 0.0–0.1)
Immature Granulocytes: 0 %
LDH: 196 IU/L (ref 119–226)
LDL Calculated: 113 mg/dL — ABNORMAL HIGH (ref 0–99)
Lymphocytes Absolute: 2.1 10*3/uL (ref 0.7–3.1)
Lymphs: 31 %
MCH: 30.8 pg (ref 26.6–33.0)
MCHC: 32.7 g/dL (ref 31.5–35.7)
MCV: 94 fL (ref 79–97)
Monocytes Absolute: 0.5 10*3/uL (ref 0.1–0.9)
Monocytes: 8 %
NEUTROS ABS: 3.8 10*3/uL (ref 1.4–7.0)
Neutrophils: 59 %
PLATELETS: 365 10*3/uL (ref 150–450)
POTASSIUM: 4.8 mmol/L (ref 3.5–5.2)
Phosphorus: 3.5 mg/dL (ref 2.5–4.5)
RBC: 4.68 x10E6/uL (ref 3.77–5.28)
RDW: 13.8 % (ref 12.3–15.4)
SODIUM: 137 mmol/L (ref 134–144)
T3 UPTAKE RATIO: 30 % (ref 24–39)
T4, Total: 6.9 ug/dL (ref 4.5–12.0)
TOTAL PROTEIN: 7.8 g/dL (ref 6.0–8.5)
TSH: 2.44 u[IU]/mL (ref 0.450–4.500)
Triglycerides: 100 mg/dL (ref 0–149)
URIC ACID: 6 mg/dL (ref 2.5–7.1)
VLDL CHOLESTEROL CAL: 20 mg/dL (ref 5–40)
WBC: 6.6 10*3/uL (ref 3.4–10.8)

## 2017-12-08 LAB — B12 AND FOLATE PANEL
Folate: 15.6 ng/mL (ref >3.0)
VITAMIN B 12: 355 pg/mL (ref 232–1245)

## 2017-12-08 LAB — VITAMIN D 25 HYDROXY (VIT D DEFICIENCY, FRACTURES): VIT D 25 HYDROXY: 35.2 ng/mL (ref 30.0–100.0)

## 2017-12-13 ENCOUNTER — Ambulatory Visit: Payer: Self-pay | Admitting: Medical

## 2017-12-13 ENCOUNTER — Encounter: Payer: Self-pay | Admitting: Medical

## 2017-12-13 VITALS — BP 120/74 | HR 92 | Temp 98.3°F | Resp 16 | Ht 64.0 in | Wt 163.4 lb

## 2017-12-13 DIAGNOSIS — Z0000 Encounter for general adult medical examination without abnormal findings: Secondary | ICD-10-CM

## 2017-12-13 DIAGNOSIS — E7800 Pure hypercholesterolemia, unspecified: Secondary | ICD-10-CM

## 2017-12-13 DIAGNOSIS — Z0289 Encounter for other administrative examinations: Secondary | ICD-10-CM

## 2017-12-13 LAB — POCT URINALYSIS DIPSTICK
Bilirubin, UA: NEGATIVE
GLUCOSE UA: NEGATIVE
Ketones, UA: NEGATIVE
LEUKOCYTES UA: NEGATIVE
Nitrite, UA: NEGATIVE
Protein, UA: NEGATIVE
Urobilinogen, UA: 0.2 E.U./dL
pH, UA: 6 (ref 5.0–8.0)

## 2017-12-13 NOTE — Progress Notes (Signed)
Subjective:    Patient ID: Jasmin Henry, female    DOB: October 26, 1980, 37 y.o.   MRN: [id number]  HPI 37 yo female in non acute distress herr for annual physical exam. Feeling  Better on energy level since doing B12 injections. No Complaints today. 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  Last eye exam : Last papsmear: Review of Systems  Constitutional: Negative for unexpected weight change.  HENT: Negative.   Eyes: Negative.   Respiratory: Negative.   Cardiovascular: Negative.   Gastrointestinal: Negative.   Endocrine: Negative.   Genitourinary: Positive for vaginal bleeding (spots on Mirena every few months.).  Musculoskeletal: Negative.  Negative for back pain (much better).  Skin: Negative.   Allergic/Immunologic: Positive for environmental allergies. Negative for food allergies.  Neurological: Negative.   Hematological: Negative.   Psychiatric/Behavioral: The patient is nervous/anxious (tried to stop Wellbutrin but anxiety became worse. ).   sulfa drugs cause nausea  Is exercising 3 times a week running / jogging.    Objective:   Physical Exam  Constitutional: She is oriented to person, place, and time. Vital signs are normal. She appears well-developed and well-nourished.  HENT:  Head: Normocephalic and atraumatic.  Right Ear: Hearing, external ear and ear canal normal. A middle ear effusion is present.  Left Ear: Hearing, external ear and ear canal normal. A middle ear effusion is present.  Nose: Nose normal.  Mouth/Throat: Uvula is midline, oropharynx is clear and moist and mucous membranes are normal. Uvula swelling (mild) present. Tonsils are 0 on the right. Tonsils are 0 on the left.  Known polyp in the right side, history of deviated septum.  Eyes: Pupils are equal, round, and reactive to light. Conjunctivae, EOM and lids are normal.  Neck: Trachea normal and normal range of motion. Neck supple.  Cardiovascular: Normal rate, regular rhythm, normal heart  sounds and intact distal pulses. Exam reveals no gallop and no friction rub.  No murmur heard. Pulses:      Carotid pulses are 2+ on the right side, and 2+ on the left side.      Radial pulses are 2+ on the right side, and 2+ on the left side.       Popliteal pulses are 2+ on the right side, and 2+ on the left side.       Dorsalis pedis pulses are 2+ on the right side, and 2+ on the left side.       Posterior tibial pulses are 2+ on the right side, and 2+ on the left side.  Pulmonary/Chest: Effort normal and breath sounds normal.  Abdominal: Soft. Normal appearance and bowel sounds are normal. There is no hepatosplenomegaly. There is tenderness (mild tenderness " like I am going to start my period.).  Musculoskeletal:       Right shoulder: Normal.       Left shoulder: Normal.       Right elbow: Normal.      Left elbow: Normal.       Right wrist: Normal.       Left wrist: Normal.       Right hip: Normal.       Left hip: Normal.       Right knee: Normal.       Left knee: Normal.       Right ankle: Normal.       Left ankle: Normal.       Cervical back: Normal.  Thoracic back: Normal.       Lumbar back: Normal.       Right upper arm: Normal.       Left upper arm: Normal.       Right forearm: Normal.       Left forearm: Normal.       Right hand: Normal.       Left hand: Normal.       Right upper leg: Normal.       Left upper leg: Normal.       Left lower leg: Normal.       Right foot: There is normal range of motion and no deformity.       Left foot: There is normal range of motion and no deformity.  Lymphadenopathy:       Head (right side): No submental, no submandibular, no tonsillar, no preauricular and no posterior auricular adenopathy present.       Head (left side): No submental, no submandibular, no tonsillar, no preauricular and no posterior auricular adenopathy present.    She has no cervical adenopathy.       Right: No supraclavicular and no epitrochlear adenopathy  present.       Left: No supraclavicular and no epitrochlear adenopathy present.  Neurological: She is alert and oriented to person, place, and time. She has normal strength. No cranial nerve deficit or sensory deficit. She displays a negative Romberg sign. GCS eye subscore is 4. GCS verbal subscore is 5. GCS motor subscore is 6.  Reflex Scores:      Brachioradialis reflexes are 2+ on the right side and 2+ on the left side.      Patellar reflexes are 2+ on the right side and 2+ on the left side.      Achilles reflexes are 2+ on the right side and 2+ on the left side. Skin: Skin is warm and dry. Capillary refill takes less than 2 seconds. Rash noted. Rash is macular and papular.     Psychiatric: She has a normal mood and affect. Her speech is normal and behavior is normal. Judgment and thought content normal. Cognition and memory are normal.  Nursing note and vitals reviewed. easily gets off the exam table , gait wnl and steady, heel to toe walk wnl, finger to nose wnl.  Deferred GU exam   Noted maculopapular rash on anterior left ankle " I got into some poison ivy, I am using hydorcortisone cream on it". No signs of infection , no drainage, healing.    urine dip large amount of blood , patient thinks she is spotting but difficult  To tell due to wearing black underwear. Assessment & Plan:   Annual exam  Reviewed labs On B12 injections, improving,  increase of B12 from 224 to 355 pg/ml.. Hematuria  Patient thinks it is due to the beginning of   her mensis. Recheck urine  in 7-10 days.  Completed [employer] exam /history for camp.Copy made for chart. Elevated cholesterol and triglycerites  (since last visit)riglyceridemia [facility] and for weight gain and increase in cholesterol and triglycerides..  To track food and exercise. Return to the clinic as needed.  Patient verbalizes understanding and has no questions at discharge.

## 2017-12-13 NOTE — Addendum Note (Signed)
Addended by: Sharin GraveOZART, Airon Sahni M on: 12/13/2017 08:32 AM   Modules accepted: Orders

## 2017-12-13 NOTE — Patient Instructions (Signed)
High Cholesterol High cholesterol is a condition in which the blood has high levels of a white, waxy, fat-like substance (cholesterol). The human body needs small amounts of cholesterol. The liver makes all the cholesterol that the body needs. Extra (excess) cholesterol comes from the food that we eat. Cholesterol is carried from the liver by the blood through the blood vessels. If you have high cholesterol, deposits (plaques) may build up on the walls of your blood vessels (arteries). Plaques make the arteries narrower and stiffer. Cholesterol plaques increase your risk for heart attack and stroke. Work with your health care provider to keep your cholesterol levels in a healthy range. What increases the risk? This condition is more likely to develop in people who:  Eat foods that are high in animal fat (saturated fat) or cholesterol.  Are overweight.  Are not getting enough exercise.  Have a family history of high cholesterol.  What are the signs or symptoms? There are no symptoms of this condition. How is this diagnosed? This condition may be diagnosed from the results of a blood test.  If you are older than age 20, your health care provider may check your cholesterol every 4-6 years.  You may be checked more often if you already have high cholesterol or other risk factors for heart disease.  The blood test for cholesterol measures:  "Bad" cholesterol (LDL cholesterol). This is the main type of cholesterol that causes heart disease. The desired level for LDL is less than 100.  "Good" cholesterol (HDL cholesterol). This type helps to protect against heart disease by cleaning the arteries and carrying the LDL away. The desired level for HDL is 60 or higher.  Triglycerides. These are fats that the body can store or burn for energy. The desired number for triglycerides is lower than 150.  Total cholesterol. This is a measure of the total amount of cholesterol in your blood, including LDL  cholesterol, HDL cholesterol, and triglycerides. A healthy number is less than 200.  How is this treated? This condition is treated with diet changes, lifestyle changes, and medicines. Diet changes  This may include eating more whole grains, fruits, vegetables, nuts, and fish.  This may also include cutting back on red meat and foods that have a lot of added sugar. Lifestyle changes  Changes may include getting at least 40 minutes of aerobic exercise 3 times a week. Aerobic exercises include walking, biking, and swimming. Aerobic exercise along with a healthy diet can help you maintain a healthy weight.  Changes may also include quitting smoking. Medicines  Medicines are usually given if diet and lifestyle changes have failed to reduce your cholesterol to healthy levels.  Your health care provider may prescribe a statin medicine. Statin medicines have been shown to reduce cholesterol, which can reduce the risk of heart disease. Follow these instructions at home: Eating and drinking  If told by your health care provider:  Eat chicken (without skin), fish, veal, shellfish, ground turkey breast, and round or loin cuts of red meat.  Do not eat fried foods or fatty meats, such as hot dogs and salami.  Eat plenty of fruits, such as apples.  Eat plenty of vegetables, such as broccoli, potatoes, and carrots.  Eat beans, peas, and lentils.  Eat grains such as barley, rice, couscous, and bulgur wheat.  Eat pasta without cream sauces.  Use skim or nonfat milk, and eat low-fat or nonfat yogurt and cheeses.  Do not eat or drink whole milk, cream, ice   cream, egg yolks, or hard cheeses.  Do not eat stick margarine or tub margarines that contain trans fats (also called partially hydrogenated oils).  Do not eat saturated tropical oils, such as coconut oil and palm oil.  Do not eat cakes, cookies, crackers, or other baked goods that contain trans fats.  General instructions  Exercise  as directed by your health care provider. Increase your activity level with activities such as gardening, walking, and taking the stairs.  Take over-the-counter and prescription medicines only as told by your health care provider.  Do not use any products that contain nicotine or tobacco, such as cigarettes and e-cigarettes. If you need help quitting, ask your health care provider.  Keep all follow-up visits as told by your health care provider. This is important. Contact a health care provider if:  You are struggling to maintain a healthy diet or weight.  You need help to start on an exercise program.  You need help to stop smoking. Get help right away if:  You have chest pain.  You have trouble breathing. This information is not intended to replace advice given to you by your health care provider. Make sure you discuss any questions you have with your health care provider. Document Released: 06/14/2005 Document Revised: 01/10/2016 Document Reviewed: 12/13/2015 Elsevier Interactive Patient Education  2018 [employer] Food Choices to Lower Your Triglycerides Triglycerides are a type of fat in your blood. High levels of triglycerides can increase the risk of heart disease and stroke. If your triglyceride levels are high, the foods you eat and your eating habits are very important. Choosing the right foods can help lower your triglycerides. What general guidelines do I need to follow?  Lose weight if you are overweight.  Limit or avoid alcohol.  Fill one half of your plate with vegetables and green salads.  Limit fruit to two servings a day. Choose fruit instead of juice.  Make one fourth of your plate whole grains. Look for the word "whole" as the first word in the ingredient list.  Fill one fourth of your plate with lean protein foods.  Enjoy fatty fish (such as salmon, mackerel, sardines, and tuna) three times a week.  Choose healthy fats.  Limit foods high in starch and  sugar.  Eat more home-cooked food and less restaurant, buffet, and fast food.  Limit fried foods.  Cook foods using methods other than frying.  Limit saturated fats.  Check ingredient lists to avoid foods with partially hydrogenated oils (trans fats) in them. What foods can I eat? Grains Whole grains, such as whole wheat or whole grain breads, crackers, cereals, and pasta. Unsweetened oatmeal, bulgur, barley, quinoa, or brown rice. Corn or whole wheat flour tortillas. Vegetables Fresh or frozen vegetables (raw, steamed, roasted, or grilled). Green salads. Fruits All fresh, canned (in natural juice), or frozen fruits. Meat and Other Protein Products Ground beef (85% or leaner), grass-fed beef, or beef trimmed of fat. Skinless chicken or [place]. Ground chicken or [place]. Pork trimmed of fat. All fish and seafood. Eggs. Dried beans, peas, or lentils. Unsalted nuts or seeds. Unsalted canned or dry beans. Dairy Low-fat dairy products, such as skim or 1% milk, 2% or reduced-fat cheeses, low-fat ricotta or cottage cheese, or plain low-fat yogurt. Fats and Oils Tub margarines without trans fats. Light or reduced-fat mayonnaise and salad dressings. Avocado. Safflower, olive, or canola oils. Natural peanut or almond butter. The items listed above may not be a complete list of recommended foods or  beverages. Contact your dietitian for more options. What foods are not recommended? Grains White bread. White pasta. White rice. Cornbread. Bagels, pastries, and croissants. Crackers that contain trans fat. Vegetables White potatoes. Corn. Creamed or fried vegetables. Vegetables in a cheese sauce. Fruits Dried fruits. Canned fruit in light or heavy syrup. Fruit juice. Meat and Other Protein Products Fatty cuts of meat. Ribs, chicken wings, bacon, sausage, bologna, salami, chitterlings, fatback, hot dogs, bratwurst, and packaged luncheon meats. Dairy Whole or 2% milk, cream, half-and-half, and cream  cheese. Whole-fat or sweetened yogurt. Full-fat cheeses. Nondairy creamers and whipped toppings. Processed cheese, cheese spreads, or cheese curds. Sweets and Desserts Corn syrup, sugars, honey, and molasses. Candy. Jam and jelly. Syrup. Sweetened cereals. Cookies, pies, cakes, donuts, muffins, and ice cream. Fats and Oils Butter, stick margarine, lard, shortening, ghee, or bacon fat. Coconut, palm kernel, or palm oils. Beverages Alcohol. Sweetened drinks (such as sodas, lemonade, and fruit drinks or punches). The items listed above may not be a complete list of foods and beverages to avoid. Contact your dietitian for more information. This information is not intended to replace advice given to you by your health care provider. Make sure you discuss any questions you have with your health care provider. Document Released: 04/01/2004 Document Revised: 11/20/2015 Document Reviewed: 04/18/2013 Elsevier Interactive Patient Education  2017 [employer].

## 2017-12-19 ENCOUNTER — Encounter: Payer: Self-pay | Admitting: Medical

## 2017-12-20 ENCOUNTER — Encounter: Payer: Self-pay | Admitting: Medical

## 2017-12-20 ENCOUNTER — Ambulatory Visit: Payer: Self-pay | Admitting: Medical

## 2017-12-20 VITALS — BP 138/78 | HR 66 | Temp 98.4°F | Resp 16 | Wt 162.4 lb

## 2017-12-20 DIAGNOSIS — R319 Hematuria, unspecified: Secondary | ICD-10-CM

## 2017-12-20 LAB — POCT URINALYSIS DIPSTICK
Bilirubin, UA: NEGATIVE
Glucose, UA: NEGATIVE
Ketones, UA: NEGATIVE
LEUKOCYTES UA: NEGATIVE
Nitrite, UA: NEGATIVE
PH UA: 7.5 (ref 5.0–8.0)
Protein, UA: NEGATIVE
Spec Grav, UA: <=1.005 — AB (ref 1.010–1.025)
UROBILINOGEN UA: 0.2 U/dL

## 2017-12-20 NOTE — Patient Instructions (Signed)
Most likely this is spotting from  your period.  Return to the clinic in  10 days for a a recheck of your urine.   Hematuria, Adult Hematuria is blood in your urine. It can be caused by a bladder infection, kidney infection, prostate infection, kidney stone, or cancer of your urinary tract. Infections can usually be treated with medicine, and a kidney stone usually will pass through your urine. If neither of these is the cause of your hematuria, further workup to find out the reason may be needed. It is very important that you tell your health care provider about any blood you see in your urine, even if the blood stops without treatment or happens without causing pain. Blood in your urine that happens and then stops and then happens again can be a symptom of a very serious condition. Also, pain is not a symptom in the initial stages of many urinary cancers. Follow these instructions at home:  Drink lots of fluid, 3-4 quarts a day. If you have been diagnosed with an infection, cranberry juice is especially recommended, in addition to large amounts of water.  Avoid caffeine, tea, and carbonated beverages because they tend to irritate the bladder.  Avoid alcohol because it may irritate the prostate.  Take all medicines as directed by your health care provider.  If you were prescribed an antibiotic medicine, finish it all even if you start to feel better.  If you have been diagnosed with a kidney stone, follow your health care provider's instructions regarding straining your urine to catch the stone.  Empty your bladder often. Avoid holding urine for long periods of time.  After a bowel movement, women should cleanse front to back. Use each tissue only once.  Empty your bladder before and after sexual intercourse if you are a female. Contact a health care provider if:  You develop back pain.  You have a fever.  You have a feeling of sickness in your stomach (nausea) or vomiting.  Your  symptoms are not better in 3 days. Return sooner if you are getting worse. Get help right away if:  You develop severe vomiting and are unable to keep the medicine down.  You develop severe back or abdominal pain despite taking your medicines.  You begin passing a large amount of blood or clots in your urine.  You feel extremely weak or faint, or you pass out. This information is not intended to replace advice given to you by your health care provider. Make sure you discuss any questions you have with your health care provider. Document Released: 06/14/2005 Document Revised: 11/20/2015 Document Reviewed: 02/12/2013 Elsevier Interactive Patient Education  2017 [employer]

## 2017-12-20 NOTE — Progress Notes (Signed)
   Subjective:    Patient ID: Jasmin Henry, female    DOB: 02/24/1981, 37 y.o.   MRN: [id number]  HPI  37 yo female in non acute distress.  Patient returns today for recheck of blood in urine, during annual physical exam  she had a lot of blood on her urine dipstick.  No complaints during the exam.   Review of Systems  Constitutional: Negative for chills and fever.  Gastrointestinal: Negative for abdominal pain.  Genitourinary: Positive for vaginal discharge (has noticed some pink discharge at times). Negative for decreased urine volume, dysuria, flank pain, frequency, hematuria and urgency.  Musculoskeletal: Negative for back pain.       Objective:   Physical Exam  Constitutional: She is oriented to person, place, and time. She appears well-developed and well-nourished.  HENT:  Head: Normocephalic and atraumatic.  Eyes: Pupils are equal, round, and reactive to light. Conjunctivae and EOM are normal.  Neurological: She is alert and oriented to person, place, and time.  Skin: Skin is warm and dry.  Psychiatric: She has a normal mood and affect. Her behavior is normal. Judgment and thought content normal.  Nursing note and vitals reviewed.  Urine dipstick a trace of blood is noted.( just a few specs of blood noted on dipstick).       Assessment & Plan:  Trace hematuria Will recheck in another 10 days to make sure she is not having and increase of hematuria. She feels she is spotting still and her OB/GYN provider is aware of this. [place]. AVS Igiven on hematuria. To follow up with OB/GYN on vaginal discharge. Patient verbalizes understanding and has no questions at discharge.

## 2018-01-03 DIAGNOSIS — F331 Major depressive disorder, recurrent, moderate: Secondary | ICD-10-CM | POA: Diagnosis not present

## 2018-01-03 DIAGNOSIS — E559 Vitamin D deficiency, unspecified: Secondary | ICD-10-CM | POA: Diagnosis not present

## 2018-01-03 DIAGNOSIS — D519 Vitamin B12 deficiency anemia, unspecified: Secondary | ICD-10-CM | POA: Diagnosis not present

## 2018-01-04 ENCOUNTER — Ambulatory Visit: Payer: Self-pay | Admitting: Medical

## 2018-01-04 ENCOUNTER — Encounter: Payer: Self-pay | Admitting: Medical

## 2018-01-04 VITALS — BP 119/63 | HR 80 | Temp 98.6°F | Resp 16

## 2018-01-04 DIAGNOSIS — Z87448 Personal history of other diseases of urinary system: Secondary | ICD-10-CM

## 2018-01-04 LAB — POCT URINALYSIS DIPSTICK
Bilirubin, UA: NEGATIVE
Blood, UA: NEGATIVE
GLUCOSE UA: NEGATIVE
KETONES UA: NEGATIVE
LEUKOCYTES UA: NEGATIVE
Nitrite, UA: NEGATIVE
Protein, UA: NEGATIVE
Spec Grav, UA: 1.015 (ref 1.010–1.025)
UROBILINOGEN UA: 0.2 U/dL
pH, UA: 6.5 (ref 5.0–8.0)

## 2018-01-04 NOTE — Progress Notes (Signed)
   Subjective:    Patient ID: Jasmin Henry, female    DOB: 01/20/1981, 37 y.o.   MRN: [id number]  HPI  37 yo female in non acute distress.  Patient returns for recheck of blood in urine from annual exam. No complaints today.Well rested from 1.5 weeks off at July holiday.   Review of Systems  Constitutional: Negative for chills.  Respiratory: Negative for shortness of breath.   Cardiovascular: Negative for chest pain.       Objective:   Physical Exam  Constitutional: She is oriented to person, place, and time. She appears well-developed and well-nourished.  Neurological: She is alert and oriented to person, place, and time.  Skin: Skin is warm and dry.  Psychiatric: She has a normal mood and affect. Her behavior is normal. Judgment and thought content normal.    Easily gets off the exam table gait wnl steady.  Urine dip  wnl no hmaturia    Assessment & Plan:  Hematuria  Resolved. Return to clinic as needed. Patient verbalizes understanding and has no questions at discharge.

## 2018-01-17 ENCOUNTER — Ambulatory Visit: Payer: Self-pay

## 2018-01-17 NOTE — Progress Notes (Unsigned)
Nutrition 01/17/2018  CC:  Increased cholesterol levels, weight gain.    O: HT: 63 inches   WT: 163 (without shoes). 12/07/2017 Total cholesterol = 207 mg/dl, LDL cholesterol = 161113 mg/dl,  HDL = 74 mg/dl, TG = 096100 mg/dl, VLDL = 20 mg/dl. S:  Notes that she has a strong family hx for high cholesterol and heart disease. Has not been exercising/walk/running over the last 8 weeks as the weather has become warmer. Eating is more of a challenge.  Having difficulty with making a weekly plan and staying on target. C/O having gained weight over the last 6-8 weeks. Currently working an average of 60 hr/week. Recommendations: Get back to physical activity.  Explore your suggestion of using the concourse at the new gym to walk/slow jog during the day. Plan for 300-500 calories per meal, spread throughout the day.  Plan for a breakfast, lunch and afternoon snack to limit the [phone] calorie dinner. Goals: Lose some weight.  Aiming to get into the 150 lbs range by next visit. Explore the availability of gym concourse for walking. Begin to walk to increase metabolism.  Location [employer]. Cholesterol and Triglycerides handout.  Follow-up:  6-8 weeks for weight and support.  Jasmin Henry, [place]RN,RD,LDN

## 2018-03-16 ENCOUNTER — Ambulatory Visit: Payer: Self-pay

## 2018-05-02 ENCOUNTER — Telehealth: Payer: Self-pay

## 2018-05-02 NOTE — Telephone Encounter (Signed)
Pt called after hour nurse for breast sxs she is having.  Was adv to call and schedule appt.  I called pt to f/u; tx'd to SP to sched appt.

## 2018-05-03 ENCOUNTER — Ambulatory Visit: Payer: BLUE CROSS/BLUE SHIELD | Admitting: Obstetrics & Gynecology

## 2018-05-03 ENCOUNTER — Encounter: Payer: Self-pay | Admitting: Obstetrics & Gynecology

## 2018-05-03 VITALS — BP 120/70 | Ht 64.0 in | Wt 155.0 lb

## 2018-05-03 DIAGNOSIS — N644 Mastodynia: Secondary | ICD-10-CM | POA: Diagnosis not present

## 2018-05-03 NOTE — Progress Notes (Signed)
HPI:      [name] is a 37 y.o. [id number] who is premenopausal, presents today for a problem visit.  She complains of breast tenderness on the leftside which she first noticed one month ago.  It is outer side of breast, no mass.  Has h/o breast cyst on right years ago.  STress at work as always, drinks a lot of caffeine, has Mirena for cycle and birth control, year 4.  It has not significantly changed.  Associated symptoms include none.  Denies nipple discharge or skin changes.  No fever.  Prior Mammogram: no. Prior breast problems: No Family History: Breast Cancer-relatedfamily history is not on file.  PMHx: She  has a past medical history of Anxiety, Asthma, Back pain, Complication of anesthesia, Depression, and PONV (postoperative nausea and vomiting). Also,  has a past surgical history that includes Cesarean section (DEC 2008); Wisdom tooth extraction; and Labioplasty (Bilateral, 11/28/2015)., family history includes Alcohol abuse in her maternal grandfather; Birth defects in her paternal grandmother; Cancer in her paternal grandfather; Depression in her maternal grandfather, mother, and sister; Diabetes in her paternal grandmother; Hyperlipidemia in her father, mother, and paternal grandmother; Hypertension in her father, maternal grandfather, mother, and paternal grandmother.,  reports that she has never smoked. She has never used smokeless tobacco. She reports that she drinks alcohol. She reports that she does not use drugs.  She has a current medication list which includes the following prescription(s): bupropion, cetirizine, cholecalciferol, and levonorgestrel. Also, is allergic to sulfa antibiotics.  Review of Systems  Constitutional: Negative for chills, fever and malaise/fatigue.  HENT: Negative for congestion, sinus pain and sore throat.   Eyes: Negative for blurred vision and pain.  Respiratory: Negative for cough and wheezing.   Cardiovascular: Negative for chest pain and  leg swelling.  Gastrointestinal: Negative for abdominal pain, constipation, diarrhea, heartburn, nausea and vomiting.  Genitourinary: Negative for dysuria, frequency, hematuria and urgency.  Musculoskeletal: Negative for back pain, joint pain, myalgias and neck pain.  Skin: Negative for itching and rash.  Neurological: Negative for dizziness, tremors and weakness.  Endo/Heme/Allergies: Does not bruise/bleed easily.  Psychiatric/Behavioral: Negative for depression. The patient is not nervous/anxious and does not have insomnia.     Objective: BP 120/70   Ht 5\' 4"  (1.626 m)   Wt 155 lb (70.3 kg)   BMI 26.61 kg/m  Physical Exam  Constitutional: She is oriented to person, place, and time. She appears well-developed and well-nourished. No distress.  Cardiovascular: Normal rate, regular rhythm, normal heart sounds and normal pulses. Exam reveals no gallop and no friction rub.  No murmur heard. Pulmonary/Chest: Effort normal and breath sounds normal. She exhibits no mass, no tenderness and no edema. Right breast exhibits no inverted nipple, no mass, no nipple discharge, no skin change and no tenderness. Left breast exhibits no inverted nipple, no mass, no nipple discharge, no skin change and no tenderness. No breast swelling or tenderness.  Tender UOQ and LOQ of left breast but no mass palpated  Abdominal: Normal appearance.  Musculoskeletal: Normal range of motion.  Lymphadenopathy:    She has no axillary adenopathy.       Right axillary: No pectoral and no lateral adenopathy present.       Left axillary: No pectoral and no lateral adenopathy present. Neurological: She is alert and oriented to person, place, and time.  Skin: Skin is warm and dry. No abrasion, no bruising, no lesion and no rash noted. No erythema.  Psychiatric: She has  a normal mood and affect. Her speech is normal and behavior is normal. Judgment normal.  Vitals reviewed.  ASSESSMENT/PLAN: mastalgia, likely due to  fibrocystic changes 1. Breast pain, left Caffeine restriction, monitor stress, Vit E supplementation discussed as options. Consider breast US if worsens or feels cyst  Plan annual w PAP soon   Annamarie Major, MD, [facility], [facility] 05/03/2018  8:31 AM

## 2018-05-16 DIAGNOSIS — F331 Major depressive disorder, recurrent, moderate: Secondary | ICD-10-CM | POA: Diagnosis not present

## 2018-05-16 DIAGNOSIS — E559 Vitamin D deficiency, unspecified: Secondary | ICD-10-CM | POA: Diagnosis not present

## 2018-05-16 DIAGNOSIS — D519 Vitamin B12 deficiency anemia, unspecified: Secondary | ICD-10-CM | POA: Diagnosis not present

## 2018-05-17 DIAGNOSIS — L2089 Other atopic dermatitis: Secondary | ICD-10-CM | POA: Diagnosis not present

## 2018-05-17 DIAGNOSIS — L700 Acne vulgaris: Secondary | ICD-10-CM | POA: Diagnosis not present

## 2018-09-12 DIAGNOSIS — F331 Major depressive disorder, recurrent, moderate: Secondary | ICD-10-CM | POA: Diagnosis not present

## 2018-09-12 DIAGNOSIS — D519 Vitamin B12 deficiency anemia, unspecified: Secondary | ICD-10-CM | POA: Diagnosis not present

## 2018-09-12 DIAGNOSIS — E559 Vitamin D deficiency, unspecified: Secondary | ICD-10-CM | POA: Diagnosis not present

## 2018-09-21 DIAGNOSIS — Z79899 Other long term (current) drug therapy: Secondary | ICD-10-CM | POA: Diagnosis not present

## 2018-09-21 DIAGNOSIS — L700 Acne vulgaris: Secondary | ICD-10-CM | POA: Diagnosis not present

## 2018-11-16 ENCOUNTER — Telehealth: Payer: BLUE CROSS/BLUE SHIELD | Admitting: Nurse Practitioner

## 2018-11-16 ENCOUNTER — Other Ambulatory Visit: Payer: Self-pay

## 2018-11-16 DIAGNOSIS — Z889 Allergy status to unspecified drugs, medicaments and biological substances status: Secondary | ICD-10-CM

## 2018-11-16 DIAGNOSIS — H9201 Otalgia, right ear: Secondary | ICD-10-CM

## 2018-11-16 DIAGNOSIS — H6691 Otitis media, unspecified, right ear: Secondary | ICD-10-CM

## 2018-11-16 MED ORDER — AZITHROMYCIN 250 MG PO TABS: ORAL_TABLET | ORAL | 0 refills | Status: DC

## 2018-11-16 NOTE — Progress Notes (Signed)
   Subjective:    Patient ID: Jasmin Henry, female    DOB: May 17, 1981, 38 y.o.   MRN: [id number]  HPI Jasmin Henry is on a telephonic visit with reports of right ear pain x 2 days. She reports a temp of 100 as of today. She also c/o headache, some chest tightness and sore throat. Reports nonproductive intermittant cough but feels this is "allergy related" Denies SOB, facial pressure or pain or wheezing. Denies recent travel, No known COVID exposure. Has been taking Zyrtec and Sudafed and symptoms have worsened.  Reports hx of childhood/adolescent asthma    Verbal consent given today for treatment  Review of Systems  Constitutional: Positive for fever.  HENT: Positive for ear pain and sore throat. Negative for sinus pressure and sinus pain.   Respiratory: Negative for shortness of breath.   Cardiovascular: Negative for chest pain.  Gastrointestinal: Negative for diarrhea, nausea and vomiting.  Skin: Negative for rash.       Objective:   Physical Exam Neurological:     Mental Status: She is oriented to person, place, and time.   answers questions appropriately and promptly        Assessment & Plan:

## 2018-11-16 NOTE — Patient Instructions (Addendum)
Right otitis media, unspecified otitis media type - Plan: azithromycin (ZITHROMAX) 250 MG tablet  History of seasonal allergies  Otalgia, right  Nice speaking with you today Jasmin Henry! Please increase your fluid intake and take meds as directed. This includes tylenol as needed for ear pain If you continue to run a temp or your symptoms aren't improving after recommended treatment, please call the office for further evaluation.  Patient verbalized understanding of all instructions given/reviewed and has no further questions or concerns at this time.     Otitis Media, Adult   Otitis media occurs when there is inflammation and fluid in the middle ear. Your middle ear is a part of the ear that contains bones for hearing as well as air that helps send sounds to your brain. What are the causes? This condition is caused by a blockage in the eustachian tube. This tube drains fluid from the ear to the back of the nose (nasopharynx). A blockage in this tube can be caused by an object or by swelling (edema) in the tube. Problems that can cause a blockage include:  A cold or other upper respiratory infection.  Allergies.  An irritant, such as tobacco smoke.  Enlarged adenoids. The adenoids are areas of soft tissue located high in the back of the throat, behind the nose and the roof of the mouth.  A mass in the nasopharynx.  Damage to the ear caused by pressure changes (barotrauma). What are the signs or symptoms? Symptoms of this condition include:  Ear pain.  A fever.  Decreased hearing.  A headache.  Tiredness (lethargy).  Fluid leaking from the ear.  Ringing in the ear. How is this diagnosed? This condition is diagnosed with a physical exam. During the exam your health care provider will use an instrument called an otoscope to look into your ear and check for redness, swelling, and fluid. He or she will also ask about your symptoms. Your health care provider may also order tests,  such as:  A test to check the movement of the eardrum (pneumatic otoscopy). This test is done by squeezing a small amount of air into the ear.  A test that changes air pressure in the middle ear to check how well the eardrum moves and whether the eustachian tube is working (tympanogram). How is this treated? This condition usually goes away on its own within 3-5 days. But if the condition is caused by a bacteria infection and does not go away own its own, or keeps coming back, your health care provider may:  Prescribe antibiotic medicines to treat the infection.  Prescribe or recommend medicines to control pain. Follow these instructions at home:  Take over-the-counter and prescription medicines only as told by your health care provider.  If you were prescribed an antibiotic medicine, take it as told by your health care provider. Do not stop taking the antibiotic even if you start to feel better.  Keep all follow-up visits as told by your health care provider. This is important. Contact a health care provider if:  You have bleeding from your nose.  There is a lump on your neck.  You are not getting better in 5 days.  You feel worse instead of better. Get help right away if:  You have severe pain that is not controlled with medicine.  You have swelling, redness, or pain around your ear.  You have stiffness in your neck.  A part of your face is paralyzed.  The bone behind your  ear (mastoid) is tender when you touch it.  You develop a severe headache. Summary  Otitis media is redness, soreness, and swelling of the middle ear.  This condition usually goes away on its own within 3-5 days.  If the problem does not go away in 3-5 days, your health care provider may prescribe or recommend medicines to treat your symptoms.  If you were prescribed an antibiotic medicine, take it as told by your health care provider. This information is not intended to replace advice given to  you by your health care provider. Make sure you discuss any questions you have with your health care provider. Document Released: 03/19/2004 Document Revised: 06/04/2016 Document Reviewed: 06/04/2016 Elsevier Interactive Patient Education  2019 [employer]

## 2019-01-09 DIAGNOSIS — D519 Vitamin B12 deficiency anemia, unspecified: Secondary | ICD-10-CM | POA: Diagnosis not present

## 2019-01-09 DIAGNOSIS — F331 Major depressive disorder, recurrent, moderate: Secondary | ICD-10-CM | POA: Diagnosis not present

## 2019-01-09 DIAGNOSIS — E559 Vitamin D deficiency, unspecified: Secondary | ICD-10-CM | POA: Diagnosis not present

## 2019-03-07 ENCOUNTER — Other Ambulatory Visit: Payer: Self-pay | Admitting: Medical

## 2019-03-07 DIAGNOSIS — Z0000 Encounter for general adult medical examination without abnormal findings: Secondary | ICD-10-CM

## 2019-03-07 DIAGNOSIS — Z114 Encounter for screening for human immunodeficiency virus [HIV]: Secondary | ICD-10-CM

## 2019-03-21 ENCOUNTER — Telehealth: Payer: Self-pay | Admitting: Medical

## 2019-03-21 DIAGNOSIS — R21 Rash and other nonspecific skin eruption: Secondary | ICD-10-CM

## 2019-03-21 MED ORDER — PREDNISONE 10 MG (21) PO TBPK: ORAL_TABLET | ORAL | 0 refills | Status: DC

## 2019-03-21 NOTE — Telephone Encounter (Signed)
Permission to treat patient by telemedicine.  38 yo female in non acute distress. Has a rash starting at wrists on Friday (6days ago) and now spreading to chest , neck and abdomen and inner thighs. "very itchy" but not painful. No problems swallowing/ breathing. No N/V/D. Describes rash as red bumps the size of an eraser head and smaller.The wrists are now blotchy with erythema, she states she has been scratching the area..   She has not worked in the yard or felt she had been bitten by an insect. " It could just be stress". She has taken her daily Zyrtec and Beneadryl 25mg  today. She takes 50mg  of Benadryl at night. She also had tried hydrocortisone cream and that did seem to help with the itching but the rash continued to spread.   patient denies pregnancy.  Allergies  Allergen Reactions  . Sulfa Antibiotics Nausea And Vomiting   PE: not preformed telemedicine appt.  Dx/Plan:  Generalized rash, uticarial. Continue Zyrtec and Benadryl as she has been taking. Avoid heat, lukewarm showers. Reviewed with patient that Benadryl can be sedative. Meds ordered this encounter  Medications  . predniSONE (STERAPRED UNI-PAK 21 TAB) 10 MG (21) TBPK tablet    Sig: Take 6 tablets by mouth today then  5 tablets tomorrow then one less every day thereafter. Take with food.    Dispense:  21 tablet    Refill:  0  Patient has appointment for labs tomorrow , I will look at rash at that time.  If SOB or difficulty swalliowing to call 911. Patient verbalizes understanding and has no questions at he end of our conversation.

## 2019-03-22 ENCOUNTER — Other Ambulatory Visit: Payer: BC Managed Care – PPO

## 2019-03-22 ENCOUNTER — Other Ambulatory Visit: Payer: Self-pay

## 2019-03-22 DIAGNOSIS — Z0000 Encounter for general adult medical examination without abnormal findings: Secondary | ICD-10-CM

## 2019-03-22 DIAGNOSIS — Z114 Encounter for screening for human immunodeficiency virus [HIV]: Secondary | ICD-10-CM

## 2019-03-22 LAB — POCT URINALYSIS DIPSTICK
Bilirubin, UA: NEGATIVE
Glucose, UA: NEGATIVE
Ketones, UA: NEGATIVE
Leukocytes, UA: NEGATIVE
Nitrite, UA: NEGATIVE
Protein, UA: POSITIVE — AB
Spec Grav, UA: 1.015 (ref 1.010–1.025)
Urobilinogen, UA: 0.2 E.U./dL
pH, UA: 6.5 (ref 5.0–8.0)

## 2019-03-23 LAB — CMP12+LP+TP+TSH+6AC+CBC/D/PLT
ALT: 15 IU/L (ref 0–32)
AST: 17 IU/L (ref 0–40)
Albumin/Globulin Ratio: 1.8 (ref 1.2–2.2)
Albumin: 4.8 g/dL (ref 3.8–4.8)
Alkaline Phosphatase: 87 IU/L (ref 39–117)
BUN/Creatinine Ratio: 17 (ref 9–23)
BUN: 15 mg/dL (ref 6–20)
Basophils Absolute: 0.1 10*3/uL (ref 0.0–0.2)
Basos: 1 %
Bilirubin Total: 0.2 mg/dL (ref 0.0–1.2)
Calcium: 9.2 mg/dL (ref 8.7–10.2)
Chloride: 103 mmol/L (ref 96–106)
Chol/HDL Ratio: 2.8 ratio (ref 0.0–4.4)
Cholesterol, Total: 202 mg/dL — ABNORMAL HIGH (ref 100–199)
Creatinine, Ser: 0.9 mg/dL (ref 0.57–1.00)
EOS (ABSOLUTE): 0.1 10*3/uL (ref 0.0–0.4)
Eos: 1 %
Estimated CHD Risk: <0.5 times avg. (ref 0.0–1.0)
Free Thyroxine Index: 2.4 (ref 1.2–4.9)
GFR calc Af Amer: 93 mL/min/{1.73_m2} (ref >59)
GFR calc non Af Amer: 81 mL/min/{1.73_m2} (ref >59)
GGT: 21 IU/L (ref 0–60)
Globulin, Total: 2.7 g/dL (ref 1.5–4.5)
Glucose: 100 mg/dL — ABNORMAL HIGH (ref 65–99)
HDL: 72 mg/dL (ref >39)
Hematocrit: 40.1 % (ref 34.0–46.6)
Hemoglobin: 13.3 g/dL (ref 11.1–15.9)
Immature Grans (Abs): 0 10*3/uL (ref 0.0–0.1)
Immature Granulocytes: 0 %
Iron: 85 ug/dL (ref 27–159)
LDH: 177 IU/L (ref 119–226)
LDL Chol Calc (NIH): 115 mg/dL — ABNORMAL HIGH (ref 0–99)
Lymphocytes Absolute: 2 10*3/uL (ref 0.7–3.1)
Lymphs: 25 %
MCH: 30.7 pg (ref 26.6–33.0)
MCHC: 33.2 g/dL (ref 31.5–35.7)
MCV: 93 fL (ref 79–97)
Monocytes Absolute: 0.6 10*3/uL (ref 0.1–0.9)
Monocytes: 8 %
Neutrophils Absolute: 5.2 10*3/uL (ref 1.4–7.0)
Neutrophils: 65 %
Phosphorus: 2.8 mg/dL — ABNORMAL LOW (ref 3.0–4.3)
Platelets: 349 10*3/uL (ref 150–450)
Potassium: 4.6 mmol/L (ref 3.5–5.2)
RBC: 4.33 x10E6/uL (ref 3.77–5.28)
RDW: 12.7 % (ref 11.7–15.4)
Sodium: 142 mmol/L (ref 134–144)
T3 Uptake Ratio: 31 % (ref 24–39)
T4, Total: 7.8 ug/dL (ref 4.5–12.0)
TSH: 1.83 u[IU]/mL (ref 0.450–4.500)
Total Protein: 7.5 g/dL (ref 6.0–8.5)
Triglycerides: 84 mg/dL (ref 0–149)
Uric Acid: 4.7 mg/dL (ref 2.5–7.1)
VLDL Cholesterol Cal: 15 mg/dL (ref 5–40)
WBC: 8 10*3/uL (ref 3.4–10.8)

## 2019-03-23 LAB — VITAMIN D 25 HYDROXY (VIT D DEFICIENCY, FRACTURES): Vit D, 25-Hydroxy: 20.1 ng/mL — ABNORMAL LOW (ref 30.0–100.0)

## 2019-03-23 LAB — URINALYSIS, ROUTINE W REFLEX MICROSCOPIC
Bilirubin, UA: NEGATIVE
Glucose, UA: NEGATIVE
Ketones, UA: NEGATIVE
Leukocytes,UA: NEGATIVE
Nitrite, UA: NEGATIVE
RBC, UA: NEGATIVE
Specific Gravity, UA: 1.026 (ref 1.005–1.030)
Urobilinogen, Ur: 0.2 mg/dL (ref 0.2–1.0)
pH, UA: 6 (ref 5.0–7.5)

## 2019-03-23 LAB — HIV ANTIBODY (ROUTINE TESTING W REFLEX): HIV Screen 4th Generation wRfx: NONREACTIVE

## 2019-03-24 LAB — URINE CULTURE: Organism ID, Bacteria: NO GROWTH

## 2019-04-02 ENCOUNTER — Ambulatory Visit: Payer: BC Managed Care – PPO | Admitting: Medical

## 2019-04-02 ENCOUNTER — Encounter: Payer: Self-pay | Admitting: Medical

## 2019-04-02 ENCOUNTER — Other Ambulatory Visit: Payer: Self-pay

## 2019-04-02 ENCOUNTER — Telehealth: Payer: Self-pay | Admitting: Medical

## 2019-04-02 VITALS — BP 133/65 | HR 74 | Temp 99.5°F | Resp 16 | Ht 64.0 in | Wt 159.6 lb

## 2019-04-02 DIAGNOSIS — R82998 Other abnormal findings in urine: Secondary | ICD-10-CM

## 2019-04-02 DIAGNOSIS — Z0000 Encounter for general adult medical examination without abnormal findings: Secondary | ICD-10-CM

## 2019-04-02 DIAGNOSIS — Z8639 Personal history of other endocrine, nutritional and metabolic disease: Secondary | ICD-10-CM

## 2019-04-02 DIAGNOSIS — E559 Vitamin D deficiency, unspecified: Secondary | ICD-10-CM

## 2019-04-02 DIAGNOSIS — Z23 Encounter for immunization: Secondary | ICD-10-CM

## 2019-04-02 DIAGNOSIS — E7800 Pure hypercholesterolemia, unspecified: Secondary | ICD-10-CM

## 2019-04-02 LAB — POCT URINALYSIS DIPSTICK
Bilirubin, UA: NEGATIVE
Glucose, UA: NEGATIVE
Ketones, UA: NEGATIVE
Nitrite, UA: NEGATIVE
Protein, UA: NEGATIVE
Spec Grav, UA: 1.02 (ref 1.010–1.025)
Urobilinogen, UA: 0.2 E.U./dL
pH, UA: 7 (ref 5.0–8.0)

## 2019-04-02 NOTE — Patient Instructions (Signed)
Hypophosphatemia Hypophosphatemia is when the level of phosphate in a person's blood is low. Phosphate is an important mineral (electrolyte) for the strength and structure of bones and teeth. Phosphate is also important for muscle functioning. Low blood phosphate levels can cause a variety of symptoms and problems. What are the causes? Rapid (acute) onset of this condition may be caused by:  Alcoholism.  Severe burns.  Being fed nutrition through an IV (total parenteral nutrition).  Refeeding after a long period of starvation or poor nutrition.  A complication of diabetes that involves high blood glucose levels (diabetic ketoacidosis). Slower (chronic) onset of this condition may be caused by:  Chronic diarrhea.  Vitamin D deficiency.  Excessive use of antacids containing aluminum.  Excessive use of diuretics.  Overactive parathyroid glands (hyperparathyroidism).  Use of steroid medicines.  Underactive thyroid glands (hypothyroidism).  Low levels of other electrolytes, such as magnesium (hypomagnesemia) or potassium (hypokalemia).  Genetic kidney problems, such as autosomal dominant hypophosphatemic rickets.  A condition caused by certain types of tumors (oncogenic osteomalacia). What are the signs or symptoms? Symptoms of this condition include:  Bone pain.  Bowed legs.  Growth problems, such as short height.  Weak muscles.  Confusion.  Shortness of breath.  Seizures. How is this diagnosed?  This condition is usually diagnosed through blood tests. How is this treated? Treatment for this condition may include:  Phosphate given by mouth (orally) or given through an IV inserted into one of your veins. The method used for giving phosphate will depend on the severity of the condition.  Monitoring of your phosphate and other electrolyte levels. You may need to be monitored in the hospital during treatment. Other treatments will depend on the underlying cause of  the condition. Follow these instructions at home:  Follow diet instructions from your health care provider or dietitian.  Keep all follow-up visits as told by your health care provider. This is important. Contact a health care provider if you:  Develop increased muscle weakness. Get help right away if you:  Have chest pain.  Have difficulty breathing.  Think you may have a bone fracture.  Have severe pain in your joints or bones. Summary  Hypophosphatemia is when the level of phosphate in a person's blood is low.  Low blood phosphate levels can cause a variety of symptoms and problems.  Treatment includes phosphate given by mouth or IV, along with monitoring your phosphate and other electrolyte levels. You may need to be monitored in the hospital during treatment.  Contact a health care provider if you develop increased muscle weakness. This information is not intended to replace advice given to you by your health care provider. Make sure you discuss any questions you have with your health care provider. Document Released: 09/29/2010 Document Revised: 10/05/2018 Document Reviewed: 03/07/2018 Elsevier Patient Education  2020 [employer].

## 2019-04-02 NOTE — Telephone Encounter (Signed)
Last week, patient with diarrhea Approximately 5 times on Wednesday and Thursday then  2 times/ day on Friday, Saturday and Sunday. Today she feels fine.  She had no fever/chills, no cough, no CP, no SOB,  No loss of taste or smell, no ST.  She is scheduled for a physical today. I wanted to touch base with her and make sure she was well for her physical. She states she feels "Fine" today.  I will see the patient at  3pm for her physical. Patient verbalizes understanding and has no questions at discharge.

## 2019-04-02 NOTE — Progress Notes (Signed)
Subjective:    Patient ID: Jasmin Henry, female    DOB: 10/08/1980, 38 y.o.   MRN: [id number]  HPI 38 yo female in non acute distress, here for annual physical.Rash almost gone treated one week ago with Prednisone taper..  No diarrhea today.  No complaints today. Allergies  Allergen Reactions  . Sulfa Antibiotics Nausea And Vomiting     Current Outpatient Medications:  .  azithromycin (ZITHROMAX) 250 MG tablet, 2 tabs PO x1 day; then 1 tab PO x 4 days, Disp: 6 tablet, Rfl: 0 .  buPROPion (WELLBUTRIN) 75 MG tablet, Take 150 mg by mouth 2 (two) times daily. , Disp: , Rfl:  .  cetirizine (ZYRTEC) 10 MG tablet, Take 10 mg by mouth daily., Disp: , Rfl:  .  cholecalciferol (VITAMIN D) 1000 units tablet, Take 1,000 Units by mouth daily. Take 4 gummies daily, Disp: , Rfl:  .  levonorgestrel (MIRENA) 20 MCG/24HR IUD, 1 each by Intrauterine route once., Disp: , Rfl:  .  magic mouthwash w/lidocaine SOLN, Magic Mouthwash  Combo of viscous lidocaine, diphenhydramine and maalox in equal parts  Swish, gargle, and spit one to two teaspoonfuls every six hours as needed. May be swallowed if esophageal involvement. Shake well before using., Disp: , Rfl:  .  Multiple Vitamin (MULTIVITAMIN) capsule, Take 1 capsule by mouth daily., Disp: , Rfl:  .  predniSONE (STERAPRED UNI-PAK 21 TAB) 10 MG (21) TBPK tablet, Take 6 tablets by mouth today then  5 tablets tomorrow then one less every day thereafter. Take with food., Disp: 21 tablet, Rfl: 0  Review of Systems  Constitutional: Positive for fatigue (works alot  knows  vit D is low.).  HENT: Positive for sneezing (allergies taking zrytec and flonase).   Eyes: Negative.   Respiratory: Negative.   Cardiovascular: Negative.   Gastrointestinal: Positive for diarrhea (over the last week.).  Endocrine: Negative.   Genitourinary: Negative.   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  Skin: Positive for rash (better almost  gone).  Allergic/Immunologic: Positive for environmental allergies. Negative for food allergies.  Hematological: Negative.   Psychiatric/Behavioral: Positive for sleep disturbance (8pm then wakes up at  1 pm,). Negative for agitation, behavioral problems, confusion, decreased concentration, dysphoric mood, hallucinations, self-injury and suicidal ideas. The patient is nervous/anxious. The patient is not hyperactive.    History of a feeling like she had a migraine  2-3 weeks ago , N/V sensitivity to light /sound , dizziness, temple and forehead pain. No fever or chills. Went home and slept  12 hours and felt better.    Has had a couple of panic attacks lasting  10 min used  Mindfulness and breathing technique.   Need female exam,  Objective:   Physical Exam Vitals signs and nursing note reviewed.  Constitutional:      Appearance: Normal appearance. She is well-developed, well-groomed and normal weight.  HENT:     Head: Normocephalic and atraumatic.     Jaw: There is normal jaw occlusion.     Right Ear: Hearing, tympanic membrane, ear canal and external ear normal.     Left Ear: Hearing, tympanic membrane, ear canal and external ear normal.     Nose: Nose normal.     Mouth/Throat:     Lips: Pink.     Mouth: Mucous membranes are moist.     Pharynx: Oropharynx is clear.  Eyes:     General: Lids are normal.     Extraocular  Movements: Extraocular movements intact.     Conjunctiva/sclera: Conjunctivae normal.     Pupils: Pupils are equal, round, and reactive to light.  Neck:     Musculoskeletal: Normal range of motion and neck supple.     Thyroid: No thyroid mass, thyromegaly or thyroid tenderness.     Trachea: Trachea normal.  Cardiovascular:     Rate and Rhythm: Normal rate and regular rhythm.     Pulses: Normal pulses.          Carotid pulses are 2+ on the right side and 2+ on the left side.      Radial pulses are 2+ on the right side and 2+ on the left side.       Dorsalis pedis  pulses are 2+ on the right side and 2+ on the left side.       Posterior tibial pulses are 2+ on the right side and 2+ on the left side.     Heart sounds: Normal heart sounds.  Pulmonary:     Effort: Pulmonary effort is normal.     Breath sounds: Normal breath sounds.  Abdominal:     General: Abdomen is flat. Bowel sounds are normal.     Palpations: Abdomen is soft.     Tenderness: There is abdominal tenderness in the epigastric area.     Hernia: No hernia is present.  Musculoskeletal: Normal range of motion.     Right lower leg: No edema.     Left lower leg: No edema.  Feet:     Right foot:     Skin integrity: Skin integrity normal.     Toenail Condition: Right toenails are normal.     Left foot:     Skin integrity: Skin integrity normal.     Toenail Condition: Left toenails are normal.  Lymphadenopathy:     Head:     Right side of head: No submental, tonsillar, preauricular, posterior auricular or occipital adenopathy.     Left side of head: No submental, submandibular, tonsillar, preauricular, posterior auricular or occipital adenopathy.     Cervical: No cervical adenopathy.     Right cervical: No superficial, deep or posterior cervical adenopathy.    Left cervical: No superficial, deep or posterior cervical adenopathy.     Upper Body:     Right upper body: No supraclavicular or epitrochlear adenopathy.     Left upper body: No supraclavicular or epitrochlear adenopathy.  Skin:    General: Skin is warm and dry.     Capillary Refill: Capillary refill takes less than 2 seconds.  Neurological:     General: No focal deficit present.     Mental Status: She is alert and oriented to person, place, and time.     GCS: GCS eye subscore is 4. GCS verbal subscore is 5. GCS motor subscore is 6.     Cranial Nerves: Cranial nerves are intact.     Sensory: Sensation is intact.     Motor: Motor function is intact.     Coordination: Coordination is intact.     Gait: Gait is intact.      Deep Tendon Reflexes:     Reflex Scores:      Brachioradialis reflexes are 2+ on the right side and 2+ on the left side.      Patellar reflexes are 2+ on the right side and 2+ on the left side.      Achilles reflexes are 2+ on the right side and 2+ on the  left side. Psychiatric:        Attention and Perception: Attention and perception normal.        Mood and Affect: Mood and affect normal.        Speech: Speech normal.        Behavior: Behavior normal. Behavior is cooperative.        Thought Content: Thought content normal.        Cognition and Memory: Cognition and memory normal.        Judgment: Judgment normal.   mild epigastric pain with palpation reviewed gerd and bland foods, also trying a Prilosec treatment for  2 weeks.  Recent Results (from the past 2160 hour(s))  HIV Antibody (routine testing w rflx)     Status: None   Collection Time: 03/22/19  8:05 AM  Result Value Ref Range   HIV Screen 4th Generation wRfx Non Reactive Non Reactive  VITAMIN D 25 Hydroxy (Vit-D Deficiency, Fractures)     Status: Abnormal   Collection Time: 03/22/19  8:05 AM  Result Value Ref Range   Vit D, 25-Hydroxy 20.1 (L) 30.0 - 100.0 ng/mL    Comment: Vitamin D deficiency has been defined by the [place] and an Endocrine Society practice guideline as a level of serum 25-OH vitamin D less than 20 ng/mL (1,2). The Endocrine Society went on to further define vitamin D insufficiency as a level between 21 and 29 ng/mL (2). 1. IOM (Institute of Medicine). 2010. Dietary reference    intakes for calcium and D. [place]: The    [employer]. 2. Holick MF, Binkley [place], Bischoff-Ferrari HA, et al.    Evaluation, treatment, and prevention of vitamin D    deficiency: an Endocrine Society clinical practice    guideline. JCEM. 2011 Jul; 96(7):1911-30.   CMP12+LP+TP+TSH+6AC+CBC/D/Plt     Status: Abnormal   Collection Time: 03/22/19  8:05 AM  Result Value Ref Range   Glucose 100  (H) 65 - 99 mg/dL   Uric Acid 4.7 2.5 - 7.1 mg/dL    Comment:            Therapeutic target for gout patients: <6.0   BUN 15 6 - 20 mg/dL   Creatinine, Ser 0.90 0.57 - 1.00 mg/dL   GFR calc non Af Amer 81 >59 mL/min/1.73   GFR calc Af Amer 93 >59 mL/min/1.73   BUN/Creatinine Ratio 17 9 - 23   Sodium 142 134 - 144 mmol/L   Potassium 4.6 3.5 - 5.2 mmol/L   Chloride 103 96 - 106 mmol/L   Calcium 9.2 8.7 - 10.2 mg/dL   Phosphorus 2.8 (L) 3.0 - 4.3 mg/dL   Total Protein 7.5 6.0 - 8.5 g/dL   Albumin 4.8 3.8 - 4.8 g/dL   Globulin, Total 2.7 1.5 - 4.5 g/dL   Albumin/Globulin Ratio 1.8 1.2 - 2.2   Bilirubin Total 0.2 0.0 - 1.2 mg/dL   Alkaline Phosphatase 87 39 - 117 IU/L   LDH 177 119 - 226 IU/L   AST 17 0 - 40 IU/L   ALT 15 0 - 32 IU/L   GGT 21 0 - 60 IU/L   Iron 85 27 - 159 ug/dL   Cholesterol, Total 202 (H) 100 - 199 mg/dL   Triglycerides 84 0 - 149 mg/dL   HDL 72 >39 mg/dL   VLDL Cholesterol Cal 15 5 - 40 mg/dL   LDL Chol Calc (NIH) 115 (H) 0 - 99 mg/dL   Chol/HDL Ratio 2.8 0.0 -  4.4 ratio    Comment:                                   T. Chol/HDL Ratio                                             Men  Women                               1/2 Avg.Risk  3.4    3.3                                   Avg.Risk  5.0    4.4                                2X Avg.Risk  9.6    7.1                                3X Avg.Risk 23.4   11.0    Estimated CHD Risk  < 0.5 0.0 - 1.0 times avg.    Comment: The CHD Risk is based on the T. Chol/HDL ratio. Other factors affect CHD Risk such as hypertension, smoking, diabetes, severe obesity, and family history of premature CHD.    TSH 1.830 0.450 - 4.500 uIU/mL   T4, Total 7.8 4.5 - 12.0 ug/dL   T3 Uptake Ratio 31 24 - 39 %   Free Thyroxine Index 2.4 1.2 - 4.9   WBC 8.0 3.4 - 10.8 x10E3/uL   RBC 4.33 3.77 - 5.28 x10E6/uL   Hemoglobin 13.3 11.1 - 15.9 g/dL   Hematocrit 84.6 65.9 - 46.6 %   MCV 93 79 - 97 fL   MCH 30.7 26.6 - 33.0 pg   MCHC 33.2  31.5 - 35.7 g/dL   RDW 93.5 70.1 - 77.9 %   Platelets 349 150 - 450 x10E3/uL   Neutrophils 65 Not Estab. %   Lymphs 25 Not Estab. %   Monocytes 8 Not Estab. %   Eos 1 Not Estab. %   Basos 1 Not Estab. %   Neutrophils Absolute 5.2 1.4 - 7.0 x10E3/uL   Lymphocytes Absolute 2.0 0.7 - 3.1 x10E3/uL   Monocytes Absolute 0.6 0.1 - 0.9 x10E3/uL   EOS (ABSOLUTE) 0.1 0.0 - 0.4 x10E3/uL   Basophils Absolute 0.1 0.0 - 0.2 x10E3/uL   Immature Granulocytes 0 Not Estab. %   Immature Grans (Abs) 0.0 0.0 - 0.1 x10E3/uL  POCT urinalysis dipstick     Status: Abnormal   Collection Time: 03/22/19  8:20 AM  Result Value Ref Range   Color, UA     Clarity, UA     Glucose, UA Negative Negative   Bilirubin, UA negative    Ketones, UA negative    Spec Grav, UA 1.015 1.010 - 1.025   Blood, UA trace    pH, UA 6.5 5.0 - 8.0   Protein, UA Positive (A) Negative   Urobilinogen, UA 0.2 0.2 or 1.0 E.U./dL   Nitrite, UA negative    Leukocytes,  UA Negative Negative   Appearance     Odor    Urine Culture     Status: None   Collection Time: 03/22/19  8:20 AM   Specimen: Urine   URINE  Result Value Ref Range   Urine Culture, Routine Final report    Organism ID, Bacteria No growth   Urinalysis, Routine w reflex microscopic     Status: None   Collection Time: 03/22/19  8:20 AM  Result Value Ref Range   Specific Gravity, UA 1.026 1.005 - 1.030   pH, UA 6.0 5.0 - 7.5   Color, UA Yellow Yellow   Appearance Ur Clear Clear   Leukocytes,UA Negative Negative   Protein,UA Trace Negative/Trace   Glucose, UA Negative Negative   Ketones, UA Negative Negative   RBC, UA Negative Negative   Bilirubin, UA Negative Negative   Urobilinogen, Ur 0.2 0.2 - 1.0 mg/dL   Nitrite, UA Negative Negative   Microscopic Examination Comment     Comment: Microscopic not indicated and not performed.       Assessment & Plan:  Annual physical exam Vit D3 deficiency Recheck D in  3 months, increase Vit D 2000IU/day. Dry needling  consult desired. Leukocytosis in urine. Urinalysis and  C&S collect tomorrow patient could not go again in clinic, original sample volume not enough to send off for culture. Decreased phosphorus, educational material given to patient. 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 Return in 3 months for recheck of Vit D will also check  B12. And Folate and lipid panel.  Patient verbalizes understanding and has no questions at the end of discharge  Orders Placed This Encounter  Procedures  . Urine Culture    Standing Status:   Future    Number of Occurrences:   1    Standing Expiration Date:   04/01/2020  . Flu Vaccine QUAD 6+ mos PF IM (Fluarix Quad PF)  . Urinalysis, Routine w reflex microscopic    Standing Status:   Future    Number of Occurrences:   1    Standing Expiration Date:   04/01/2020  . VITAMIN D 25 Hydroxy (Vit-D Deficiency, Fractures)    Standing Status:   Future    Number of Occurrences:   1    Standing Expiration Date:   04/01/2020  . B12 and Folate Panel    Standing Status:   Future    Number of Occurrences:   1    Standing Expiration Date:   04/01/2020  . Lipid panel    Standing Status:   Future    Standing Expiration Date:   04/02/2020  . POCT urinalysis dipstick  .

## 2019-04-03 ENCOUNTER — Encounter: Payer: Self-pay | Admitting: Medical

## 2019-04-03 ENCOUNTER — Other Ambulatory Visit: Payer: Self-pay

## 2019-04-03 ENCOUNTER — Other Ambulatory Visit: Payer: BC Managed Care – PPO

## 2019-04-03 DIAGNOSIS — E559 Vitamin D deficiency, unspecified: Secondary | ICD-10-CM

## 2019-04-03 DIAGNOSIS — R82998 Other abnormal findings in urine: Secondary | ICD-10-CM

## 2019-04-03 DIAGNOSIS — Z20822 Contact with and (suspected) exposure to covid-19: Secondary | ICD-10-CM

## 2019-04-03 DIAGNOSIS — Z20828 Contact with and (suspected) exposure to other viral communicable diseases: Secondary | ICD-10-CM | POA: Diagnosis not present

## 2019-04-03 DIAGNOSIS — Z8639 Personal history of other endocrine, nutritional and metabolic disease: Secondary | ICD-10-CM

## 2019-04-04 ENCOUNTER — Encounter: Payer: Self-pay | Admitting: Medical

## 2019-04-05 LAB — NOVEL CORONAVIRUS, NAA: SARS-CoV-2, NAA: NOT DETECTED

## 2019-04-05 LAB — URINE CULTURE

## 2019-04-09 ENCOUNTER — Encounter: Payer: Self-pay | Admitting: Medical

## 2019-04-09 NOTE — Progress Notes (Signed)
Normal Flora. Preformed due to dipstick in clinic on annual physical  showing trace leukocytes, patient asymptomatic.

## 2019-05-22 DIAGNOSIS — E559 Vitamin D deficiency, unspecified: Secondary | ICD-10-CM | POA: Diagnosis not present

## 2019-05-22 DIAGNOSIS — D519 Vitamin B12 deficiency anemia, unspecified: Secondary | ICD-10-CM | POA: Diagnosis not present

## 2019-05-22 DIAGNOSIS — F331 Major depressive disorder, recurrent, moderate: Secondary | ICD-10-CM | POA: Diagnosis not present

## 2019-06-05 ENCOUNTER — Other Ambulatory Visit: Payer: Self-pay

## 2019-06-05 DIAGNOSIS — Z20822 Contact with and (suspected) exposure to covid-19: Secondary | ICD-10-CM

## 2019-06-06 LAB — NOVEL CORONAVIRUS, NAA: SARS-CoV-2, NAA: NOT DETECTED

## 2019-06-19 ENCOUNTER — Ambulatory Visit: Payer: BC Managed Care – PPO | Attending: Internal Medicine

## 2019-06-19 DIAGNOSIS — Z20822 Contact with and (suspected) exposure to covid-19: Secondary | ICD-10-CM

## 2019-06-19 DIAGNOSIS — Z20828 Contact with and (suspected) exposure to other viral communicable diseases: Secondary | ICD-10-CM | POA: Diagnosis not present

## 2019-06-21 LAB — NOVEL CORONAVIRUS, NAA: SARS-CoV-2, NAA: NOT DETECTED

## 2019-08-14 ENCOUNTER — Encounter: Payer: Self-pay | Admitting: Medical

## 2019-08-14 ENCOUNTER — Ambulatory Visit: Payer: BC Managed Care – PPO

## 2019-08-14 ENCOUNTER — Telehealth: Payer: BC Managed Care – PPO | Admitting: Medical

## 2019-08-14 ENCOUNTER — Other Ambulatory Visit: Payer: Self-pay

## 2019-08-14 DIAGNOSIS — R0981 Nasal congestion: Secondary | ICD-10-CM

## 2019-08-14 DIAGNOSIS — R0982 Postnasal drip: Secondary | ICD-10-CM

## 2019-08-14 DIAGNOSIS — R509 Fever, unspecified: Secondary | ICD-10-CM

## 2019-08-14 DIAGNOSIS — R519 Headache, unspecified: Secondary | ICD-10-CM

## 2019-08-14 DIAGNOSIS — H9203 Otalgia, bilateral: Secondary | ICD-10-CM

## 2019-08-14 MED ORDER — AZITHROMYCIN 250 MG PO TABS: ORAL_TABLET | ORAL | 0 refills | Status: DC

## 2019-08-14 NOTE — Progress Notes (Signed)
39 yo female in non acute distress gives permission for telemedicine appointment.  Symptoms started on Sunday  with headache fullness in  ears and PND, rhinorrhea, yellow slight. No cough.  Review of Systems  Constitutional: Positive for malaise/fatigue (napping more. She is working from home.). Negative for chills and fever (101).  HENT: Positive for congestion, ear pain (painful today  R>L), sinus pain (frontal and nasal) and sore throat (mild).        Post nasal drip  Eyes: Negative for discharge.  Respiratory: Negative for cough, shortness of breath and wheezing.   Cardiovascular: Negative for chest pain and PND.  Gastrointestinal: Positive for diarrhea and nausea (currently menistrating and itis her norm to have naussea and diarrhea during this time period.).  Genitourinary: Negative for dysuria.  Musculoskeletal: Negative for myalgias.  Neurological: Positive for headaches. Negative for dizziness and weakness.   Feeling better today.  Woke up and took Ibuprofen and last checked temp at  8 am and it was  99 (she states her normal body temp is  97 degrees). Eating well.  Has been tested for Covid -19  , no prior diagnosis.  Scheduled at  3:30pm tomorrow for Covid-19 testing at our clinic.  PE non preformed due to telemedicine appointment. Patient is AXOX3, speaking in full sentences, does not sound SOB or in any distress. Thought process wnl.  Dx/Plan Sinus headache Rhinorrhea, Ear pain R>L Rest , Increase fluids, OTC Motrin or Tylenol per package instructions. Has been tested for Covid -19  , no prior diagnosis.  Scheduled at  3:30pm tomorrow for Covid-19 testing at our clinic. Prescribed Z-pak for ear pain that has been worsening. If worsening she needs to call the office. Return to clinic in  3-5 days if not improving. She verbalizes understanding and has no questions at the end of our conversation.

## 2019-08-14 NOTE — Patient Instructions (Signed)

## 2019-08-15 ENCOUNTER — Ambulatory Visit: Payer: BC Managed Care – PPO

## 2019-08-15 ENCOUNTER — Other Ambulatory Visit: Payer: Self-pay

## 2019-08-15 DIAGNOSIS — Z20822 Contact with and (suspected) exposure to covid-19: Secondary | ICD-10-CM

## 2019-08-15 DIAGNOSIS — L700 Acne vulgaris: Secondary | ICD-10-CM | POA: Diagnosis not present

## 2019-08-15 LAB — POC COVID19 BINAXNOW: SARS Coronavirus 2 Ag: NEGATIVE

## 2019-08-16 LAB — NOVEL CORONAVIRUS, NAA: SARS-CoV-2, NAA: NOT DETECTED

## 2019-08-17 ENCOUNTER — Telehealth: Payer: Self-pay

## 2019-08-17 NOTE — Telephone Encounter (Signed)
Patient notified that her Covid Antigen PCR is negative. She is without fever and is no longer having any symptoms.

## 2019-08-17 NOTE — Progress Notes (Signed)
Armando Gang RN will contact patient with results

## 2019-08-17 NOTE — Progress Notes (Signed)
Symptomatic patient  presents to [facility] for Rapid Covid Testing on 08/15/19. Symptoms are runny nose, ear pain and and temp 100.  Covid Rapid Antigen Test is negative and PCR sent.

## 2019-08-23 ENCOUNTER — Other Ambulatory Visit: Payer: Self-pay

## 2019-08-23 DIAGNOSIS — Z8639 Personal history of other endocrine, nutritional and metabolic disease: Secondary | ICD-10-CM

## 2019-08-23 DIAGNOSIS — E559 Vitamin D deficiency, unspecified: Secondary | ICD-10-CM

## 2019-08-23 DIAGNOSIS — E7800 Pure hypercholesterolemia, unspecified: Secondary | ICD-10-CM

## 2019-08-24 LAB — LIPID PANEL
Chol/HDL Ratio: 2.6 ratio (ref 0.0–4.4)
Cholesterol, Total: 180 mg/dL (ref 100–199)
HDL: 68 mg/dL (ref >39)
LDL Chol Calc (NIH): 99 mg/dL (ref 0–99)
Triglycerides: 71 mg/dL (ref 0–149)
VLDL Cholesterol Cal: 13 mg/dL (ref 5–40)

## 2019-08-24 LAB — SPECIMEN STATUS

## 2019-08-24 LAB — B12 AND FOLATE PANEL
Folate: 12 ng/mL (ref >3.0)
Vitamin B-12: 250 pg/mL (ref 232–1245)

## 2019-08-24 LAB — VITAMIN D 25 HYDROXY (VIT D DEFICIENCY, FRACTURES): Vit D, 25-Hydroxy: 25 ng/mL — ABNORMAL LOW (ref 30.0–100.0)

## 2019-08-28 ENCOUNTER — Other Ambulatory Visit: Payer: Self-pay | Admitting: Medical

## 2019-09-12 DIAGNOSIS — D519 Vitamin B12 deficiency anemia, unspecified: Secondary | ICD-10-CM | POA: Diagnosis not present

## 2019-09-12 DIAGNOSIS — F331 Major depressive disorder, recurrent, moderate: Secondary | ICD-10-CM | POA: Diagnosis not present

## 2019-09-13 ENCOUNTER — Other Ambulatory Visit: Payer: Self-pay

## 2019-09-13 ENCOUNTER — Telehealth: Payer: Self-pay

## 2019-09-13 DIAGNOSIS — D496 Neoplasm of unspecified behavior of brain: Secondary | ICD-10-CM | POA: Diagnosis not present

## 2019-09-13 MED ORDER — ADAPALENE 0.3 % EX GEL: 1.0000 "application " | Freq: Every day | CUTANEOUS | 3 refills | Status: DC

## 2019-09-14 NOTE — Telephone Encounter (Signed)
error 

## 2019-11-15 ENCOUNTER — Ambulatory Visit: Payer: BC Managed Care – PPO | Admitting: Dermatology

## 2019-12-07 ENCOUNTER — Ambulatory Visit: Payer: Self-pay | Admitting: Nurse Practitioner

## 2019-12-07 ENCOUNTER — Encounter: Payer: Self-pay | Admitting: Nurse Practitioner

## 2019-12-07 ENCOUNTER — Other Ambulatory Visit: Payer: Self-pay

## 2019-12-07 VITALS — BP 134/76 | HR 79 | Temp 97.9°F | Resp 18 | Ht 64.0 in | Wt 159.0 lb

## 2019-12-07 DIAGNOSIS — B379 Candidiasis, unspecified: Secondary | ICD-10-CM

## 2019-12-07 MED ORDER — FLUCONAZOLE 100 MG PO TABS: 100.0000 mg | ORAL_TABLET | Freq: Every day | ORAL | 0 refills | Status: DC

## 2019-12-07 NOTE — Progress Notes (Signed)
   Subjective:    Patient ID: Jasmin Henry, female    DOB: 1980/08/28, 39 y.o.   MRN: [id number]  HPI Jasmin Henry is here today with c/o of a skin rash x 2 weeks. She admits routine allergies and sometimes she gets rash reactions when she has allergies, but she reports she's not having the other symptoms. She has "anaphylactic" feeling like her throat is "tightening" up but not the true anaphylaxis and carries her benadryl which always has worked. She reports this reddened rash is itchy and feels irritated when something rubs it and feels it's worsened because as areas improve, others erupt and it's more affected areas. She denies anyone in her home with similar symptoms with no recent travel, ne food, new clothes or detergents or being in the woods. She denies any other symptoms or fever.    Review of Systems  Constitutional: Negative for fever.  Skin: Positive for color change and rash. Negative for wound.       Objective:   Physical Exam Constitutional:      General: She is not in acute distress.    Appearance: Normal appearance. She is not ill-appearing.  HENT:     Head: Normocephalic and atraumatic.  Pulmonary:     Effort: Pulmonary effort is normal.  Musculoskeletal:     Cervical back: Normal range of motion and neck supple.  Skin:    General: Skin is warm.     Findings: Erythema and rash present.     Comments: Several scattered irregular shaped erythematous areas with raised borders most over the left breast area. Some healed areas to the right arm and 2 to the left upper medial arm with several scattered areas to the back that look the same as described above. No drainage or scaling noted.  Neurological:     Mental Status: She is alert and oriented to person, place, and time.  Psychiatric:        Mood and Affect: Mood normal.        Behavior: Behavior normal.           Assessment & Plan:  Candidiasis of the skin: to keep skin warm and dry, take medication as directed  and decrease glucose intake. Verbalized understanding.

## 2019-12-07 NOTE — Patient Instructions (Signed)
Jasmin Henry, it was nice meeting you today! Please cut back on the agent that's contributing to your skin issue I have sent your medication to the pharmacy and take as directed I will release your labs through mychart Feel free to get some over the counter antifungal (Lotrimin AF) and take as directed Encouraged patient to call the office or primary care doctor for an appointment if no improvement in symptoms or if symptoms change or worsen after 72 hours of planned treatment. Patient verbalized understanding of all instructions given/reviewed and has no further questions or concerns at this time.

## 2019-12-08 LAB — HEMOGLOBIN A1C
Est. average glucose Bld gHb Est-mCnc: 103 mg/dL
Hgb A1c MFr Bld: 5.2 % (ref 4.8–5.6)

## 2019-12-08 LAB — CMP14+EGFR
ALT: 20 IU/L (ref 0–32)
AST: 18 IU/L (ref 0–40)
Albumin/Globulin Ratio: 1.6 (ref 1.2–2.2)
Albumin: 4.7 g/dL (ref 3.8–4.8)
Alkaline Phosphatase: 85 IU/L (ref 48–121)
BUN/Creatinine Ratio: 11 (ref 9–23)
BUN: 8 mg/dL (ref 6–24)
Bilirubin Total: 0.2 mg/dL (ref 0.0–1.2)
CO2: 24 mmol/L (ref 20–29)
Calcium: 9.4 mg/dL (ref 8.7–10.2)
Chloride: 101 mmol/L (ref 96–106)
Creatinine, Ser: 0.75 mg/dL (ref 0.57–1.00)
GFR calc Af Amer: 115 mL/min/{1.73_m2} (ref >59)
GFR calc non Af Amer: 100 mL/min/{1.73_m2} (ref >59)
Globulin, Total: 2.9 g/dL (ref 1.5–4.5)
Glucose: 92 mg/dL (ref 65–99)
Potassium: 4.6 mmol/L (ref 3.5–5.2)
Sodium: 136 mmol/L (ref 134–144)
Total Protein: 7.6 g/dL (ref 6.0–8.5)

## 2020-01-02 ENCOUNTER — Other Ambulatory Visit: Payer: Self-pay

## 2020-01-02 ENCOUNTER — Telehealth: Payer: Self-pay | Admitting: Medical

## 2020-01-02 ENCOUNTER — Ambulatory Visit: Payer: Self-pay | Admitting: *Deleted

## 2020-01-02 DIAGNOSIS — J029 Acute pharyngitis, unspecified: Secondary | ICD-10-CM

## 2020-01-02 DIAGNOSIS — Z20822 Contact with and (suspected) exposure to covid-19: Secondary | ICD-10-CM

## 2020-01-02 DIAGNOSIS — H6983 Other specified disorders of Eustachian tube, bilateral: Secondary | ICD-10-CM

## 2020-01-02 LAB — POC COVID19 BINAXNOW: SARS Coronavirus 2 Ag: NEGATIVE

## 2020-01-02 NOTE — Progress Notes (Signed)
   Subjective:    Patient ID: Jasmin Henry, female    DOB: 1981/05/16, 39 y.o.   MRN: [id number]  HPI  Gives consent to have telemedicine appointment. 39 yo female non acute distress.For POCT Covid-19 lab results and pharyngitis.   On Zyrtec and Flonase for allergies.  Using Sudafed and Benadryl.  Covid-19 Vaccination  09/22/19 negative.  Patient feels she just has a cold, but wanted to be sure. Review of Systems  Constitutional: Positive for chills. Negative for fever.  HENT: Positive for congestion, postnasal drip and sore throat. Negative for ear pain (stuffy and full.).   Eyes: Positive for itching (and watering).  Respiratory: Positive for cough (clear and white) and chest tightness (a little bit). Negative for wheezing.   Cardiovascular: Positive for chest pain (sore from coughing).  Gastrointestinal: Positive for nausea. Negative for diarrhea and vomiting.  Genitourinary: Negative for difficulty urinating.  Musculoskeletal: Negative for myalgias.  Skin: Positive for rash (yeast infection on skin, still clearing up).  Neurological: Positive for headaches. Negative for dizziness and syncope.  Psychiatric/Behavioral: Negative.  Negative for self-injury and suicidal ideas.       Objective:   No physical exam done to virtual visit. Patient alert and oriented.      Assessment & Plan:  Pharyngitis Eustachian tube dysfunction. PCR Covid-19 test Pending.  Continue to take allergy medication per package direction. And Flonase. Dilute salt water gargles 3-4 times /day. Soft foods, OTC Motrin or Tylenol for pain. Recommended Sudafed for nasal congestion as directed. Return in 3-5 days if not improving or if worsening. Patient verbalizes understanding and has no questions at discharge.

## 2020-01-03 LAB — NOVEL CORONAVIRUS, NAA: SARS-CoV-2, NAA: NOT DETECTED

## 2020-01-03 LAB — SARS-COV-2, NAA 2 DAY TAT

## 2020-01-04 ENCOUNTER — Encounter: Payer: Self-pay | Admitting: Medical

## 2020-01-11 ENCOUNTER — Encounter: Payer: Self-pay | Admitting: Medical

## 2020-01-21 DIAGNOSIS — D519 Vitamin B12 deficiency anemia, unspecified: Secondary | ICD-10-CM | POA: Diagnosis not present

## 2020-01-21 DIAGNOSIS — F331 Major depressive disorder, recurrent, moderate: Secondary | ICD-10-CM | POA: Diagnosis not present

## 2020-02-07 ENCOUNTER — Encounter: Payer: Self-pay | Admitting: Medical

## 2020-02-07 ENCOUNTER — Other Ambulatory Visit: Payer: Self-pay

## 2020-02-07 ENCOUNTER — Ambulatory Visit: Payer: Self-pay | Admitting: Medical

## 2020-02-07 VITALS — BP 133/98 | HR 90 | Temp 97.5°F | Resp 16 | Wt 155.0 lb

## 2020-02-07 DIAGNOSIS — F419 Anxiety disorder, unspecified: Secondary | ICD-10-CM

## 2020-02-07 DIAGNOSIS — R079 Chest pain, unspecified: Secondary | ICD-10-CM

## 2020-02-07 MED ORDER — HYDROXYZINE PAMOATE 25 MG PO CAPS: ORAL_CAPSULE | ORAL | 0 refills | Status: DC

## 2020-02-07 NOTE — Progress Notes (Signed)
Subjective:    Patient ID: Jasmin Henry, female    DOB: 1980/11/24, 39 y.o.   MRN: [id number]  HPI  39 yo female looking uncomfortable but in no acute distress.  Started  Suddenly at  9;50am with CP under sternum while working at her desk , laid down on couch in her office x 30 min with no relief..  Laying did not seem to decrease or increase the pain. 15 no change in pain, She describes the pan as constant stabbing  Under the middle of the sternum.,   Laying on chest in room over exam table, when I enter. She states it is a very stressful time of work, but then adds she always is this time of year. She states she has been trying to take care of herself so she would not get stressed out.  Pain is under sternum and does not change , increase or decrease with breathing.. CP does not radiate to her arm or jaw or even to the left side of chest. She does feel dizzy and slightly nauseous from the pain.  Blood pressure (!) 133/98, pulse 90, temperature (!) 97.5 F (36.4 C), temperature source Temporal, resp. rate 16, weight 155 lb (70.3 kg), SpO2 100 %.  March 2021  Pfizer Covid -19 vaccine.  Allergies  Allergen Reactions  . Sulfa Antibiotics Nausea And Vomiting   9/10  On coming to the clinic.constant , no relief.   Now  6/10 aggravating but not  where I cannot think,  Not constant like before.  Review of Systems  Respiratory: Positive for chest tightness. Negative for shortness of breath.   Cardiovascular: Positive for chest pain (stabbing and constant).  Neurological: Positive for dizziness ("a little" she drove herself here to the clinci.GAD-7 = 8 : which is mild anxiety.).  Psychiatric/Behavioral: Hallucinations:   The patient is nervous/anxious (currently taking Wellbutrin).    And is grimacing with pain.    Objective:   Physical Exam Vitals and nursing note reviewed.  Constitutional:      Appearance: She is normal weight.  HENT:     Head: Normocephalic and atraumatic.   Eyes:     Extraocular Movements: Extraocular movements intact.     Pupils: Pupils are equal, round, and reactive to light.  Cardiovascular:     Rate and Rhythm: Normal rate and regular rhythm.     Heart sounds: Heart sounds not distant. No murmur heard.  No friction rub. No gallop.   Pulmonary:     Effort: Pulmonary effort is normal. No tachypnea, accessory muscle usage or respiratory distress.     Breath sounds: Normal breath sounds. No stridor.  Musculoskeletal:        General: Normal range of motion.     Cervical back: Normal range of motion.  Skin:    General: Skin is warm.  Neurological:     General: No focal deficit present.     Mental Status: She is alert.  Psychiatric:        Mood and Affect: Mood normal.        Behavior: Behavior normal.    No swelling in ankles EKG 83 bpm NSR  No pain on palpation on left or right side of sternum    Assessment & Plan:  Chest Pain under middle of stenrum, resolving Anxiety Acute Given ASA 320mg  in clinic. Meds ordered this encounter  Medications  . hydrOXYzine (VISTARIL) 25 MG capsule    Sig: Take 1/2-1 tablet by mouth every 6  hours as needed for anxiety    Dispense:  30 capsule    Refill:  0   Try first hydroxizine 1/2 tablet , in home where it is a controlled situation and does not have to drive or  Be somewhere, in case of sedation caused by the medication. She is taking an early lunch on my recommendation and has a presentation at noon. Reviewed with patient that if pain returns  9/10 that going to the Emergency Department for CXR and blood work would be the plan. She verbalizies understanding and has no questions at discharge.  Return to the clinic as needed.

## 2020-02-27 ENCOUNTER — Ambulatory Visit: Payer: BC Managed Care – PPO | Admitting: Dermatology

## 2020-02-27 ENCOUNTER — Other Ambulatory Visit: Payer: Self-pay

## 2020-02-27 DIAGNOSIS — L988 Other specified disorders of the skin and subcutaneous tissue: Secondary | ICD-10-CM | POA: Diagnosis not present

## 2020-02-27 DIAGNOSIS — L700 Acne vulgaris: Secondary | ICD-10-CM | POA: Diagnosis not present

## 2020-02-27 MED ORDER — DAPSONE 5 % EX GEL: 1.0000 "application " | Freq: Every morning | CUTANEOUS | 11 refills | Status: AC

## 2020-02-27 MED ORDER — ADAPALENE 0.3 % EX GEL: 1.0000 "application " | Freq: Every day | CUTANEOUS | 11 refills | Status: AC

## 2020-02-27 NOTE — Progress Notes (Signed)
   Follow-Up Visit   Subjective  Analeise A Iannelli is a 39 y.o. female who presents for the following: Acne (face, back, ~ 30m f/u Adapalene 0.3% gel qhs, Dapsone 5% qam, sal acid wash). She would also like to discuss Botox.  The following portions of the chart were reviewed this encounter and updated as appropriate:  Tobacco  Allergies  Meds  Problems  Med Hx  Surg Hx  Fam Hx     Review of Systems:  No other skin or systemic complaints except as noted in HPI or Assessment and Plan.  Objective  Well appearing patient in no apparent distress; mood and affect are within normal limits.  A focused examination was performed including face, back. Relevant physical exam findings are noted in the Assessment and Plan.  Objective  face: Rhytides and volume loss.   Images                 Assessment & Plan  Acne vulgaris face  Controlled on medication  Cont Adapalene 0.3% gel qhs Cont Dapsone 5% qam Recommend Cln acne wash  Adapalene (DIFFERIN) 0.3 % gel - face  Dapsone 5 % topical gel - face  Elastosis of skin face  Discussed Botox to frown complex, recommend ~20 units  Return to be scheduled for Botox.   I, Ardis Rowan, RMA, am acting as scribe for Armida Sans, MD .  Documentation: I have reviewed the above documentation for accuracy and completeness, and I agree with the above.  Armida Sans, MD

## 2020-02-27 NOTE — Patient Instructions (Signed)
Your medications have been sent to [facility] and will be mailed to you after you call them and confirm your information with them. °  °[facility] °[phone] °[address], , [place] [address] °

## 2020-03-03 ENCOUNTER — Encounter: Payer: Self-pay | Admitting: Dermatology

## 2020-04-01 ENCOUNTER — Other Ambulatory Visit: Payer: Self-pay

## 2020-04-01 ENCOUNTER — Ambulatory Visit (INDEPENDENT_AMBULATORY_CARE_PROVIDER_SITE_OTHER): Payer: Self-pay | Admitting: Dermatology

## 2020-04-01 DIAGNOSIS — L988 Other specified disorders of the skin and subcutaneous tissue: Secondary | ICD-10-CM

## 2020-04-01 NOTE — Addendum Note (Signed)
Addended by: Piedad Standiford R on: 04/01/2020 10:00 AM   Modules accepted: Orders

## 2020-04-01 NOTE — Progress Notes (Signed)
   Follow-Up Visit   Subjective  Jasmin Henry is a 39 y.o. female who presents for the following: Facial Elastosis (frown complex, pt presents for botox today).  The following portions of the chart were reviewed this encounter and updated as appropriate:  Tobacco  Allergies  Meds  Problems  Med Hx  Surg Hx  Fam Hx     Review of Systems:  No other skin or systemic complaints except as noted in HPI or Assessment and Plan.  Objective  Well appearing patient in no apparent distress; mood and affect are within normal limits.  A focused examination was performed including face. Relevant physical exam findings are noted in the Assessment and Plan.  Objective  Head - Anterior (Face): Rhytides and volume loss.   Photos were taken at prior visit.  Images     Assessment & Plan  Elastosis of skin Head - Anterior (Face)  Botox today 20 units injected as follow: - Frown Complex 20 units  Botox Injection - Head - Anterior (Face) Location: Frown complex  Informed consent: Discussed risks (infection, pain, bleeding, bruising, swelling, allergic reaction, paralysis of nearby muscles, eyelid droop, double vision, neck weakness, difficulty breathing, headache, undesirable cosmetic result, and need for additional treatment) and benefits of the procedure, as well as the alternatives.  Informed consent was obtained.  Preparation: The area was cleansed with alcohol.  Procedure Details:  Botox was injected into the dermis with a 30-gauge needle. Pressure applied to any bleeding. Ice packs offered for swelling.  Lot Number:  [id number] Expiration:  04/2022  Total Units Injected:  20  Plan: Patient was instructed to remain upright for 4 hours. Patient was instructed to avoid massaging the face and avoid vigorous exercise for the rest of the day. Tylenol may be used for headache.  Allow 2 weeks before returning to clinic for additional dosing as needed. Patient will call for any  problems.   Return in about 3 weeks (around 04/22/2020) for Botox f/u.  I, Ardis Rowan, RMA, am acting as scribe for Armida Sans, MD .  Documentation: I have reviewed the above documentation for accuracy and completeness, and I agree with the above.  Armida Sans, MD

## 2020-04-02 ENCOUNTER — Encounter: Payer: Self-pay | Admitting: Dermatology

## 2020-04-22 ENCOUNTER — Ambulatory Visit: Payer: BC Managed Care – PPO | Admitting: Dermatology

## 2020-06-10 ENCOUNTER — Other Ambulatory Visit: Payer: Self-pay

## 2020-06-10 ENCOUNTER — Telehealth: Payer: BC Managed Care – PPO | Admitting: Medical

## 2020-06-10 ENCOUNTER — Ambulatory Visit: Payer: BC Managed Care – PPO

## 2020-06-10 DIAGNOSIS — R059 Cough, unspecified: Secondary | ICD-10-CM

## 2020-06-10 DIAGNOSIS — R519 Headache, unspecified: Secondary | ICD-10-CM

## 2020-06-10 DIAGNOSIS — Z20822 Contact with and (suspected) exposure to covid-19: Secondary | ICD-10-CM

## 2020-06-10 DIAGNOSIS — R0982 Postnasal drip: Secondary | ICD-10-CM

## 2020-06-10 DIAGNOSIS — R509 Fever, unspecified: Secondary | ICD-10-CM

## 2020-06-10 DIAGNOSIS — R197 Diarrhea, unspecified: Secondary | ICD-10-CM

## 2020-06-10 LAB — POCT RAPID STREP A (OFFICE): Rapid Strep A Screen: NEGATIVE

## 2020-06-10 LAB — POCT INFLUENZA A/B
Influenza A, POC: NEGATIVE
Influenza B, POC: NEGATIVE

## 2020-06-10 LAB — POC COVID19 BINAXNOW: SARS Coronavirus 2 Ag: NEGATIVE

## 2020-06-10 MED ORDER — AZITHROMYCIN 250 MG PO TABS: ORAL_TABLET | ORAL | 0 refills | Status: DC

## 2020-06-10 NOTE — Progress Notes (Signed)
° °  Subjective:    Patient ID: Jasmin Henry, female    DOB: 1980/12/06, 39 y.o.   MRN: [id number]  HPI 39 yo female in non acute distress comes to clinic today and gives consents for telemedicine appointment.  Patient would like  Covid-19 testing and Flu testing. Symptoms started on Sunday. They consisted of  Chills fever diarrhea cough ear pain bilaterally and headache and ST.   Allergies  Allergen Reactions   Sulfa Antibiotics Nausea And Vomiting    Current Outpatient Medications:    Adapalene (DIFFERIN) 0.3 % gel, Apply 1 application topically at bedtime. qhs to face and back for acne, Disp: 45 g, Rfl: 11   Adapalene 0.3 % gel, Apply 1 application topically daily., Disp: 45 g, Rfl: 3   buPROPion (WELLBUTRIN XL) 300 MG 24 hr tablet, Take 300 mg by mouth every morning., Disp: , Rfl:    cholecalciferol (VITAMIN D) 1000 units tablet, Take 1,000 Units by mouth daily. Take 4 gummies daily, Disp: , Rfl:    Dapsone 5 % topical gel, Apply 1 application topically in the morning. qam to face and back for acne, Disp: 60 g, Rfl: 11   fexofenadine (ALLEGRA) 180 MG tablet, Take 180 mg by mouth daily., Disp: , Rfl:    hydrOXYzine (VISTARIL) 25 MG capsule, Take 1/2-1 tablet by mouth every 6 hours as needed for anxiety, Disp: 30 capsule, Rfl: 0   levonorgestrel (MIRENA) 20 MCG/24HR IUD, 1 each by Intrauterine route once., Disp: , Rfl:    Multiple Vitamin (MULTIVITAMIN) capsule, Take 1 capsule by mouth daily., Disp: , Rfl:  vaxinated  Review of Systems  Constitutional: Positive for chills and fever.  HENT: Positive for ear pain and sore throat. Negative for congestion.   Respiratory: Positive for cough. Negative for shortness of breath and wheezing.   Cardiovascular: Negative for chest pain.  Gastrointestinal: Positive for diarrhea. Negative for nausea and vomiting.  Genitourinary: Negative for difficulty urinating.  Musculoskeletal: Positive for myalgias.  Skin: Negative for color  change and rash.  Neurological: Negative for dizziness, syncope, facial asymmetry, light-headedness and headaches.       Objective:   Physical Exam AXOX3  No physical performed due to telemedicine appointment.    No cough noted on phone call.  Flu test Negative  Covid-19 test Negative Strep Negative.    Assessment & Plan:  Cough , Fever, most likely sounds like respiratory infection. PND start an antihistimine  Like Claritin or Zyrtec per package instructions. Rest , increase fluids, soft foods. Take OTC Motrin or Tylenol for fever or pain. Per package instructions. To return to work all symptoms need to be improving and you must be fever free x 24 hours with no Motrin or Tylenol being taken.  If patient is not improving by Friday she will need to have a Covid-19 test repeated and if negative I would also include a  Covid-19 PCR on the patient Patient verbalizes understanding and has no questions at the end of our conversation.

## 2020-06-11 ENCOUNTER — Encounter: Payer: Self-pay | Admitting: Medical

## 2020-06-13 ENCOUNTER — Other Ambulatory Visit: Payer: Self-pay | Admitting: Nurse Practitioner

## 2020-09-24 ENCOUNTER — Encounter: Payer: Self-pay | Admitting: Medical

## 2020-11-03 ENCOUNTER — Other Ambulatory Visit: Payer: Self-pay

## 2020-11-03 ENCOUNTER — Ambulatory Visit: Payer: BC Managed Care – PPO | Admitting: Medical

## 2020-11-03 ENCOUNTER — Encounter: Payer: Self-pay | Admitting: Medical

## 2020-11-03 VITALS — BP 114/76 | HR 63 | Temp 97.8°F | Resp 18 | Wt 165.4 lb

## 2020-11-03 DIAGNOSIS — T63441A Toxic effect of venom of bees, accidental (unintentional), initial encounter: Secondary | ICD-10-CM

## 2020-11-03 DIAGNOSIS — L089 Local infection of the skin and subcutaneous tissue, unspecified: Secondary | ICD-10-CM

## 2020-11-03 MED ORDER — PREDNISONE 10 MG (21) PO TBPK: ORAL_TABLET | ORAL | 0 refills | Status: DC

## 2020-11-03 MED ORDER — DOXYCYCLINE HYCLATE 100 MG PO TABS
100.0000 mg | ORAL_TABLET | Freq: Two times a day (BID) | ORAL | 0 refills | Status: DC
Start: 2020-11-03 — End: 2021-05-26

## 2020-11-03 NOTE — Progress Notes (Signed)
Subjective:    Patient ID: Jasmin Henry, female    DOB: 01-Jun-1981, 40 y.o.   MRN: [id number]  HPI 40 yo female in non acute distress. Presents today with complaints of a bee sting to the right lower back laterally which occurred Saturday.  Immediately took one Benadryl and Ibuprofen 400mg . The area is  red and swollen.  Itchy and throbbing. Also feels like throat is scratchy and chest is tight. No previous bee sting had given her these symptoms before. Thought she mentions the chest tightness and throat may be anxiety induced. Previous history of anxiety.   Blood pressure 114/76, pulse 63, temperature 97.8 F (36.6 C), temperature source Oral, resp. rate 18, weight 165 lb 6.4 oz (75 kg), SpO2 95 %.   Allergies  Allergen Reactions  . Sulfa Antibiotics Nausea And Vomiting       Review of Systems  Constitutional: Negative for chills and fever.  HENT: Sore throat: scratchy.   Respiratory: Positive for chest tightness. Negative for shortness of breath and wheezing.   Cardiovascular: Negative for chest pain.       Objective:   Physical Exam Vitals and nursing note reviewed.  Constitutional:      Appearance: Normal appearance.  HENT:     Head: Normocephalic and atraumatic.     Mouth/Throat:     Mouth: Mucous membranes are moist.     Comments: Uvula mild edema and vascular looking, patient airway and no swelling to the posterior pharynx  Eyes:     Extraocular Movements: Extraocular movements intact.     Conjunctiva/sclera: Conjunctivae normal.     Pupils: Pupils are equal, round, and reactive to light.  Cardiovascular:     Rate and Rhythm: Normal rate and regular rhythm.     Heart sounds: Normal heart sounds.  Pulmonary:     Effort: Pulmonary effort is normal. No respiratory distress.     Breath sounds: Normal breath sounds. No stridor. No wheezing or rhonchi.  Chest:     Chest wall: No tenderness.  Musculoskeletal:        General: Normal range of motion.  Skin:     General: Skin is warm and dry.     Findings: Erythema (3 x 2 cm area of erythema on the lower right flank/back area) present.  Neurological:     General: No focal deficit present.     Mental Status: She is alert and oriented to person, place, and time. Mental status is at baseline.  Psychiatric:        Mood and Affect: Mood normal.        Behavior: Behavior normal.        Thought Content: Thought content normal.        Judgment: Judgment normal.   Area marked for patient to monitor       Assessment & Plan:  Bee sting OTC Zyrtec during the day and OTC Benadryl at night. Ice to the area, Ibuprofen or  Tylenol per package instructions for  Pain. Meds ordered this encounter  Medications  . doxycycline (VIBRA-TABS) 100 MG tablet    Sig: Take 1 tablet (100 mg total) by mouth 2 (two) times daily.    Dispense:  14 tablet    Refill:  0  . predniSONE (STERAPRED UNI-PAK 21 TAB) 10 MG (21) TBPK tablet    Sig: Take 6 tablets by mouth today then 5 tablets tomorrow then one less tablet every day thereafter. Take with food    Dispense:  21 tablet    Refill:  0  return in  2 days for a recheck, sooner if worsening. Patient verbalizes understanding and has no questions at discharge. Given ice pack at discharge. Reassure patient about lungs and land throat.

## 2020-11-03 NOTE — Patient Instructions (Signed)
Bee, Wasp, or [employer], Adult Bees, wasps, and hornets are part of a family of insects that can sting people. These stings can cause pain and inflammation, but they are usually not serious. However, some people may have an allergic reaction to a sting. This can cause the symptoms to be more severe. What increases the risk? You may be at a greater risk of getting stung if you:  Provoke a stinging insect by swatting or disturbing it.  Wear strong-smelling soaps, deodorants, or body sprays.  Spend time outdoors near gardens with flowers or fruit trees or in clothes that expose skin.  Eat or drink outside. What are the signs or symptoms? Common symptoms of this condition include:  A red lump in the skin that sometimes has a tiny hole in the center. In some cases, a stinger may be in the center of the wound.  Pain and itching at the sting site.  Redness and swelling around the sting site. If you have an allergic reaction (localized allergic reaction), the swelling and redness may spread out from the sting site. In some cases, this reaction can continue to develop over the next 24-48 hours. In rare cases, a person may have a severe allergic reaction (anaphylactic reaction) to a sting. Symptoms of an anaphylactic reaction may include:  Wheezing or difficulty breathing.  Raised, itchy, red patches on the skin (hives).  Nausea or vomiting.  Abdominal cramping.  Diarrhea.  Tightness in the chest or chest pain.  Dizziness or fainting.  Redness of the face (flushing).  Hoarse voice.  Swollen tongue, lips, or face. How is this diagnosed? This condition is usually diagnosed based on your symptoms and medical history as well as a physical exam. You may have an allergy test to determine if you are allergic to the substance that the insect injected during the sting (venom). How is this treated? If you were stung by a bee, the stinger and a small sac of venom may be in the wound. It is  important to remove the stinger as soon as possible. You can do this by brushing across the wound with gauze, a fingernail, or a flat card such as a credit card. Removing the stinger can help reduce the severity of your body's reaction to the sting. Most stings can be treated with:  Icing to reduce swelling in the area.  Medicines (antihistamines) to treat itching or an allergic reaction.  Medicines to help reduce pain. These may be medicines that you take by mouth, or medicated creams or lotions that you apply to your skin. Pay close attention to your symptoms after you have been stung. If possible, have someone stay with you to make sure you do not have an allergic reaction. If you have any signs of an allergic reaction, call your health care provider. If you have ever had a severe allergic reaction, your health care provider may give you an inhaler or injectable medicine (epinephrine auto-injector) to use if necessary. Follow these instructions at home:  Wash the sting site 2-3 times each day with soap and water as told by your health care provider.  Apply or take over-the-counter and prescription medicines only as told by your health care provider.  If directed, apply ice to the sting area. ? Put ice in a plastic bag. ? Place a towel between your skin and the bag. ? Leave the ice on for 20 minutes, 2-3 times a day.  Do not scratch the sting area.  If you  had a severe allergic reaction to a sting, you may need: ? To wear a medical bracelet or necklace that lists the allergy. ? To learn when and how to use an anaphylaxis kit or epinephrine injection. Your family members and coworkers may also need to learn this. ? To carry an anaphylaxis kit or epinephrine injection with you at all times.   How is this prevented?  Avoid swatting at stinging insects and disturbing insect nests.  Do not use fragrant soaps or lotions.  Wear shoes, pants, and long sleeves when spending time outdoors,  especially in grassy areas where stinging insects are common.  Keep outdoor areas free from nests or hives.  Keep food and drink containers covered when eating outdoors.  Avoid working or sitting near [employer], if possible.  Wear gloves if you are gardening or working outdoors.  If an attack by a stinging insect or a swarm seems likely in the moment, move away from the area or find a barrier between you and the insect(s), such as a door. Contact a health care provider if:  Your symptoms do not get better in 2-3 days.  You have redness, swelling, or pain that spreads beyond the area of the sting.  You have a fever. Get help right away if: You have symptoms of a severe allergic reaction. These include:  Wheezing or difficulty breathing.  Tightness in the chest or chest pain.  Light-headedness or fainting.  Itchy, raised, red patches on the skin.  Nausea or vomiting.  Abdominal cramping.  Diarrhea.  A swollen tongue or lips, or trouble swallowing.  Dizziness or fainting. Summary  Stings from bees, wasps, and hornets can cause pain and inflammation, but they are usually not serious. However, some people may have an allergic reaction to a sting. This can cause the symptoms to be more severe.  Pay close attention to your symptoms after you have been stung. If possible, have someone stay with you to make sure you do not have an allergic reaction.  Call your health care provider if you have any signs of an allergic reaction. This information is not intended to replace advice given to you by your health care provider. Make sure you discuss any questions you have with your health care provider. Document Revised: 04/08/2020 Document Reviewed: 04/08/2020 Elsevier Patient Education  2021 [employer].

## 2021-03-31 ENCOUNTER — Other Ambulatory Visit: Payer: Self-pay | Admitting: Medical

## 2021-03-31 NOTE — Progress Notes (Unsigned)
Annual labs ordered.

## 2021-05-07 ENCOUNTER — Other Ambulatory Visit: Payer: BC Managed Care – PPO

## 2021-05-07 ENCOUNTER — Other Ambulatory Visit: Payer: Self-pay

## 2021-05-07 DIAGNOSIS — Z0000 Encounter for general adult medical examination without abnormal findings: Secondary | ICD-10-CM

## 2021-05-07 LAB — POCT URINALYSIS DIPSTICK
Bilirubin, UA: NEGATIVE
Blood, UA: NEGATIVE
Glucose, UA: NEGATIVE
Ketones, UA: NEGATIVE
Leukocytes, UA: NEGATIVE
Nitrite, UA: NEGATIVE
Protein, UA: NEGATIVE
Spec Grav, UA: 1.01 (ref 1.010–1.025)
Urobilinogen, UA: 0.2 E.U./dL
pH, UA: 5 (ref 5.0–8.0)

## 2021-05-07 NOTE — Progress Notes (Signed)
Labs ordered for annual physical Patient needs pap and  baseline mammogram.

## 2021-05-07 NOTE — Addendum Note (Signed)
Addended by: Ketrick Matney, Herbert Seta R on: 05/07/2021 05:07 AM   Modules accepted: Orders

## 2021-05-09 LAB — CMP12+LP+TP+TSH+6AC+CBC/D/PLT
ALT: 14 IU/L (ref 0–32)
AST: 19 IU/L (ref 0–40)
Albumin/Globulin Ratio: 1.5 (ref 1.2–2.2)
Albumin: 4.9 g/dL — ABNORMAL HIGH (ref 3.8–4.8)
Alkaline Phosphatase: 92 IU/L (ref 44–121)
BUN/Creatinine Ratio: 14 (ref 9–23)
BUN: 12 mg/dL (ref 6–24)
Basophils Absolute: 0.1 10*3/uL (ref 0.0–0.2)
Basos: 1 %
Bilirubin Total: 0.3 mg/dL (ref 0.0–1.2)
Calcium: 9.8 mg/dL (ref 8.7–10.2)
Chloride: 100 mmol/L (ref 96–106)
Chol/HDL Ratio: 3.1 ratio (ref 0.0–4.4)
Cholesterol, Total: 231 mg/dL — ABNORMAL HIGH (ref 100–199)
Creatinine, Ser: 0.85 mg/dL (ref 0.57–1.00)
EOS (ABSOLUTE): 0 10*3/uL (ref 0.0–0.4)
Eos: 1 %
Estimated CHD Risk: <0.5 times avg. (ref 0.0–1.0)
Free Thyroxine Index: 2.3 (ref 1.2–4.9)
GGT: 19 IU/L (ref 0–60)
Globulin, Total: 3.2 g/dL (ref 1.5–4.5)
Glucose: 82 mg/dL (ref 70–99)
HDL: 74 mg/dL (ref >39)
Hematocrit: 40.9 % (ref 34.0–46.6)
Hemoglobin: 13.8 g/dL (ref 11.1–15.9)
Immature Grans (Abs): 0 10*3/uL (ref 0.0–0.1)
Immature Granulocytes: 0 %
Iron: 82 ug/dL (ref 27–159)
LDH: 181 IU/L (ref 119–226)
LDL Chol Calc (NIH): 141 mg/dL — ABNORMAL HIGH (ref 0–99)
Lymphocytes Absolute: 1.9 10*3/uL (ref 0.7–3.1)
Lymphs: 28 %
MCH: 31.4 pg (ref 26.6–33.0)
MCHC: 33.7 g/dL (ref 31.5–35.7)
MCV: 93 fL (ref 79–97)
Monocytes Absolute: 0.6 10*3/uL (ref 0.1–0.9)
Monocytes: 9 %
Neutrophils Absolute: 4.3 10*3/uL (ref 1.4–7.0)
Neutrophils: 61 %
Phosphorus: 3.2 mg/dL (ref 3.0–4.3)
Platelets: 346 10*3/uL (ref 150–450)
Potassium: 4.6 mmol/L (ref 3.5–5.2)
RBC: 4.39 x10E6/uL (ref 3.77–5.28)
RDW: 12.5 % (ref 11.7–15.4)
Sodium: 136 mmol/L (ref 134–144)
T3 Uptake Ratio: 30 % (ref 24–39)
T4, Total: 7.5 ug/dL (ref 4.5–12.0)
TSH: 1.76 u[IU]/mL (ref 0.450–4.500)
Total Protein: 8.1 g/dL (ref 6.0–8.5)
Triglycerides: 94 mg/dL (ref 0–149)
Uric Acid: 4.8 mg/dL (ref 2.6–6.2)
VLDL Cholesterol Cal: 16 mg/dL (ref 5–40)
WBC: 6.9 10*3/uL (ref 3.4–10.8)
eGFR: 88 mL/min/{1.73_m2} (ref >59)

## 2021-05-09 LAB — VITAMIN D 25 HYDROXY (VIT D DEFICIENCY, FRACTURES): Vit D, 25-Hydroxy: 24.7 ng/mL — ABNORMAL LOW (ref 30.0–100.0)

## 2021-05-09 LAB — HGB A1C W/O EAG: Hgb A1c MFr Bld: 5.3 % (ref 4.8–5.6)

## 2021-05-09 LAB — B12 AND FOLATE PANEL
Folate: 10.4 ng/mL (ref >3.0)
Vitamin B-12: 201 pg/mL — ABNORMAL LOW (ref 232–1245)

## 2021-05-09 LAB — HCV AB W REFLEX TO QUANT PCR: HCV Ab: 0.2 s/co ratio (ref 0.0–0.9)

## 2021-05-09 LAB — HCV INTERPRETATION

## 2021-05-18 ENCOUNTER — Encounter: Payer: Self-pay | Admitting: Medical

## 2021-05-18 ENCOUNTER — Other Ambulatory Visit: Payer: Self-pay

## 2021-05-18 ENCOUNTER — Ambulatory Visit: Payer: BC Managed Care – PPO | Admitting: Medical

## 2021-05-18 VITALS — BP 120/72 | HR 97 | Temp 99.2°F | Resp 18 | Ht 64.0 in | Wt 167.8 lb

## 2021-05-18 DIAGNOSIS — G8929 Other chronic pain: Secondary | ICD-10-CM

## 2021-05-18 DIAGNOSIS — M62838 Other muscle spasm: Secondary | ICD-10-CM

## 2021-05-18 DIAGNOSIS — Z0000 Encounter for general adult medical examination without abnormal findings: Secondary | ICD-10-CM

## 2021-05-18 DIAGNOSIS — E559 Vitamin D deficiency, unspecified: Secondary | ICD-10-CM

## 2021-05-18 DIAGNOSIS — E785 Hyperlipidemia, unspecified: Secondary | ICD-10-CM

## 2021-05-18 DIAGNOSIS — E538 Deficiency of other specified B group vitamins: Secondary | ICD-10-CM

## 2021-05-18 DIAGNOSIS — Z1231 Encounter for screening mammogram for malignant neoplasm of breast: Secondary | ICD-10-CM

## 2021-05-18 DIAGNOSIS — F32A Depression, unspecified: Secondary | ICD-10-CM

## 2021-05-18 DIAGNOSIS — F419 Anxiety disorder, unspecified: Secondary | ICD-10-CM

## 2021-05-18 DIAGNOSIS — E663 Overweight: Secondary | ICD-10-CM

## 2021-05-18 DIAGNOSIS — Z760 Encounter for issue of repeat prescription: Secondary | ICD-10-CM

## 2021-05-18 MED ORDER — CYCLOBENZAPRINE HCL 5 MG PO TABS: 5.0000 mg | ORAL_TABLET | Freq: Three times a day (TID) | ORAL | 2 refills | Status: DC | PRN

## 2021-05-18 MED ORDER — BUPROPION HCL ER (XL) 300 MG PO TB24: 300.0000 mg | ORAL_TABLET | Freq: Every day | ORAL | 0 refills | Status: DC

## 2021-05-18 NOTE — Patient Instructions (Addendum)
Preventing High Cholesterol Cholesterol is a white, waxy substance similar to fat that the human body needs to help build cells. The liver makes all the cholesterol that a person's body needs. Having high cholesterol (hypercholesterolemia) increases your risk for heart disease and stroke. Extra or excess cholesterol comes from the food that you eat. High cholesterol can often be prevented with diet and lifestyle changes. If you already have high cholesterol, you can control it with diet, lifestyle changes, and medicines. How can high cholesterol affect me? If you have high cholesterol, fatty deposits (plaques) may build up on the walls of your blood vessels. The blood vessels that carry blood away from your heart are called arteries. Plaques make the arteries narrower and stiffer. This in turn can: Restrict or block blood flow and cause blood clots to form. Increase your risk for heart attack and stroke. What can increase my risk for high cholesterol? This condition is more likely to develop in people who: Eat foods that are high in saturated fat or cholesterol. Saturated fat is mostly found in foods that come from animal sources. Are overweight. Are not getting enough exercise. Use products that contain nicotine or tobacco, such as cigarettes, e-cigarettes, and chewing tobacco. Have a family history of high cholesterol (familial hypercholesterolemia). What actions can I take to prevent this? Nutrition  Eat less saturated fat. Avoid trans fats (partially hydrogenated oils). These are often found in margarine and in some baked goods, fried foods, and snacks bought in packages. Avoid precooked or cured meat, such as bacon, sausages, or meat loaves. Avoid foods and drinks that have added sugars. Eat more fruits, vegetables, and whole grains. Choose healthy sources of protein, such as fish, poultry, lean cuts of red meat, beans, peas, lentils, and nuts. Choose healthy sources of fat, such  as: Nuts. Vegetable oils, especially olive oil. Fish that have healthy fats, such as omega-3 fatty acids. These fish include mackerel or salmon. Lifestyle Lose weight if you are overweight. Maintaining a healthy body mass index (BMI) can help prevent or control high cholesterol. It can also lower your risk for diabetes and high blood pressure. Ask your health care provider to help you with a diet and exercise plan to lose weight safely. Do not use any products that contain nicotine or tobacco. These products include cigarettes, chewing tobacco, and vaping devices, such as e-cigarettes. If you need help quitting, ask your health care provider. Alcohol use Do not drink alcohol if: Your health care provider tells you not to drink. You are pregnant, may be pregnant, or are planning to become pregnant. If you drink alcohol: Limit how much you have to: 0-1 drink a day for women. 0-2 drinks a day for men. Know how much alcohol is in your drink. In the [place] one drink equals one 12 oz bottle of beer (355 mL), one 5 oz glass of wine (148 mL), or one 1 oz glass of hard liquor (44 mL). Activity  Get enough exercise. Do exercises as told by your health care provider. Each week, do at least 150 minutes of exercise that takes a medium level of effort (moderate-intensity exercise). This kind of exercise: Makes your heart beat faster while allowing you to still be able to talk. Can be done in short sessions several times a day or longer sessions a few times a week. For example, on 5 days each week, you could walk fast or ride your bike 3 times a day for 10 minutes each time. Medicines Your  health care provider may recommend medicines to help lower cholesterol. This may be a medicine to lower the amount of cholesterol that your liver makes. You may need medicine if: Diet and lifestyle changes have not lowered your cholesterol enough. You have high cholesterol and other risk factors for heart disease or  stroke. Take over-the-counter and prescription medicines only as told by your health care provider. General information Manage your risk factors for high cholesterol. Talk with your health care provider about all your risk factors and how to lower your risk. Manage other conditions that you have, such as diabetes or high blood pressure (hypertension). Have blood tests to check your cholesterol levels at regular points in time as told by your health care provider. Keep all follow-up visits. This is important. Where to find more information American Heart Association: www.heart.org National Heart, Lung, and Blood Institute: [employer] Summary High cholesterol increases your risk for heart disease and stroke. By keeping your cholesterol level low, you can reduce your risk for these conditions. High cholesterol can often be prevented with diet and lifestyle changes. Work with your health care provider to manage your risk factors, and have your blood tested regularly. This information is not intended to replace advice given to you by your health care provider. Make sure you discuss any questions you have with your health care provider. Document Revised: 08/18/2020 Document Reviewed: 08/18/2020 Elsevier Patient Education  2022 [employer] Dyslipidemia Dyslipidemia is an imbalance of waxy, fat-like substances (lipids) in the blood. The body needs lipids in small amounts. Dyslipidemia often involves a high level of cholesterol or triglycerides, which are types of lipids. Common forms of dyslipidemia include: High levels of LDL cholesterol. LDL is the type of cholesterol that causes fatty deposits (plaques) to build up in the blood vessels that carry blood away from the heart (arteries). Low levels of HDL cholesterol. HDL cholesterol is the type of cholesterol that protects against heart disease. High levels of HDL remove the LDL buildup from arteries. High levels of triglycerides.  Triglycerides are a fatty substance in the blood that is linked to a buildup of plaques in the arteries. What are the causes? There are two main types of dyslipidemia: primary and secondary. Primary dyslipidemia is caused by changes (mutations) in genes that are passed down through families (inherited). These mutations cause several types of dyslipidemia. Secondary dyslipidemia may be caused by various risk factors that can lead to the disease, such as lifestyle choices and certain medical conditions. What increases the risk? You are more likely to develop this condition if you are an older man or if you are a woman who has gone through menopause. Other risk factors include: Having a family history of dyslipidemia. Taking certain medicines, including birth control pills, steroids, some diuretics, and beta-blockers. Eating a diet high in saturated fat. Smoking cigarettes or excessive alcohol intake. Having certain medical conditions such as diabetes, polycystic ovary syndrome (PCOS), kidney disease, liver disease, or hypothyroidism. Not exercising regularly. Being overweight or obese with too much belly fat. What are the signs or symptoms? In most cases, dyslipidemia does not usually cause any symptoms. In severe cases, very high lipid levels can cause: Fatty bumps under the skin (xanthomas). A white or gray ring around the black center (pupil) of the eye. Very high triglyceride levels can cause inflammation of the pancreas (pancreatitis). How is this diagnosed? Your health care provider may diagnose dyslipidemia based on a routine blood test (fasting blood test). Because most people do not  have symptoms of the condition, this blood testing (lipid profile) is done on adults age 41 and older and is repeated every 4-6 years. This test checks: Total cholesterol. This measures the total amount of cholesterol in your blood, including LDL cholesterol, HDL cholesterol, and triglycerides. A healthy  number is below 200 mg/dL (5.17 mmol/L). LDL cholesterol. The target number for LDL cholesterol is different for each person, depending on individual risk factors. A healthy number is usually below 100 mg/dL (2.59 mmol/L). Ask your health care provider what your LDL cholesterol should be. HDL cholesterol. An HDL level of 60 mg/dL (1.55 mmol/L) or higher is best because it helps to protect against heart disease. A number below 40 mg/dL (1.03 mmol/L) for men or below 50 mg/dL (1.29 mmol/L) for women increases the risk for heart disease. Triglycerides. A healthy triglyceride number is below 150 mg/dL (1.69 mmol/L). If your lipid profile is abnormal, your health care provider may do other blood tests. How is this treated? Treatment depends on the type of dyslipidemia that you have and your other risk factors for heart disease and stroke. Your health care provider will have a target range for your lipid levels based on this information. Treatment for dyslipidemia starts with lifestyle changes, such as diet and exercise. Your health care provider may recommend that you: Get regular exercise. Make changes to your diet. Quit smoking if you smoke. Limit your alcohol intake. If diet changes and exercise do not help you reach your goals, your health care provider may also prescribe medicine to lower lipids. The most commonly prescribed type of medicine lowers your LDL cholesterol (statin drug). If you have a high triglyceride level, your provider may prescribe another type of drug (fibrate) or an omega-3 fish oil supplement, or both. Follow these instructions at home: Eating and drinking  Follow instructions from your health care provider or dietitian about eating or drinking restrictions. Eat a healthy diet as told by your health care provider. This can help you reach and maintain a healthy weight, lower your LDL cholesterol, and raise your HDL cholesterol. This may include: Limiting your calories, if you  are overweight. Eating more fruits, vegetables, whole grains, fish, and lean meats. Limiting saturated fat, trans fat, and cholesterol. Do not drink alcohol if: Your health care provider tells you not to drink. You are pregnant, may be pregnant, or are planning to become pregnant. If you drink alcohol: Limit how much you have to: 0-1 drink a day for women. 0-2 drinks a day for men. Know how much alcohol is in your drink. In the [place] one drink equals one 12 oz bottle of beer (355 mL), one 5 oz glass of wine (148 mL), or one 1 oz glass of hard liquor (44 mL). Activity Get regular exercise. Start an exercise and strength training program as told by your health care provider. Ask your health care provider what activities are safe for you. Your health care provider may recommend: 30 minutes of aerobic activity 4-6 days a week. Brisk walking is an example of aerobic activity. Strength training 2 days a week. General instructions Do not use any products that contain nicotine or tobacco. These products include cigarettes, chewing tobacco, and vaping devices, such as e-cigarettes. If you need help quitting, ask your health care provider. Take over-the-counter and prescription medicines only as told by your health care provider. This includes supplements. Keep all follow-up visits. This is important. Contact a health care provider if: You are having trouble sticking to  your exercise or diet plan. You are struggling to quit smoking or to control your use of alcohol. Summary Dyslipidemia often involves a high level of cholesterol or triglycerides, which are types of lipids. Treatment depends on the type of dyslipidemia that you have and your other risk factors for heart disease and stroke. Treatment for dyslipidemia starts with lifestyle changes, such as diet and exercise. Your health care provider may prescribe medicine to lower lipids. This information is not intended to replace advice given to  you by your health care provider. Make sure you discuss any questions you have with your health care provider. Document Revised: 08/18/2020 Document Reviewed: 08/18/2020 Elsevier Patient Education  2022 [place]. Vitamin D Deficiency Vitamin D deficiency is when your body does not have enough vitamin D. Vitamin D is important to your body because: It helps your body use other minerals. It helps to keep your bones strong and healthy. It may help to prevent some diseases. It helps your heart and other muscles work well. Not getting enough vitamin D can make your bones soft. It can also cause other health problems. What are the causes? This condition may be caused by: Not eating enough foods that contain vitamin D. Not getting enough sun. Having diseases that make it hard for your body to absorb vitamin D. Having a surgery in which a part of the stomach or a part of the small intestine is removed. Having kidney disease or liver disease. What increases the risk? You are more likely to get this condition if: You are older. You do not spend much time outdoors. You live in a nursing home. You have had broken bones. You have weak or thin bones (osteoporosis). You have a disease or condition that changes how your body absorbs vitamin D. You have dark skin. You take certain medicines. You are overweight or obese. What are the signs or symptoms? In mild cases, there may not be any symptoms. If the condition is very bad, symptoms may include: Bone pain. Muscle pain. Falling often. Broken bones caused by a minor injury. How is this treated? Treatment may include taking supplements as told by your doctor. Your doctor will tell you what dose is best for you. Supplements may include: Vitamin D. Calcium. Follow these instructions at home: Eating and drinking  Eat foods that contain vitamin D, such as: Dairy products, cereals, or juices with added vitamin D. Check the label. Fish, such  as salmon or trout. Eggs. Oysters. Mushrooms. The items listed above may not be a complete list of what you can eat and drink. Contact a dietitian for more options. General instructions Take medicines and supplements only as told by your doctor. Get regular, safe exposure to natural sunlight. Do not use a tanning bed. Maintain a healthy weight. Lose weight if needed. Keep all follow-up visits as told by your doctor. This is important. How is this prevented? You can get vitamin D by: Eating foods that naturally contain vitamin D. Eating or drinking products that have vitamin D added to them, such as cereals, juices, and milk. Taking vitamin D or a multivitamin that contains vitamin D. Being in the sun. Your body makes vitamin D when your skin is exposed to sunlight. Your body changes the sunlight into a form of the vitamin that it can use. Contact a doctor if: Your symptoms do not go away. You feel sick to your stomach (nauseous). You throw up (vomit). You poop less often than normal, or you have  trouble pooping (constipation). Summary Vitamin D deficiency is when your body does not have enough vitamin D. Vitamin D helps to keep your bones strong and healthy. This condition is often treated by taking a supplement. Your doctor will tell you what dose is best for you. This information is not intended to replace advice given to you by your health care provider. Make sure you discuss any questions you have with your health care provider. Document Revised: 02/20/2018 Document Reviewed: 02/20/2018 Elsevier Patient Education  2022 [place]. Vitamin B12 Deficiency Vitamin B12 deficiency occurs when the body does not have enough vitamin B12, which is an important vitamin. The body needs this vitamin: To make red blood cells. To make DNA. This is the genetic material inside cells. To help the nerves work properly so they can carry messages from the brain to the body. Vitamin B12  deficiency can cause various health problems, such as a low red blood cell count (anemia) or nerve damage. What are the causes? This condition may be caused by: Not eating enough foods that contain vitamin B12. Not having enough stomach acid and digestive fluids to properly absorb vitamin B12 from the food that you eat. Certain digestive system diseases that make it hard to absorb vitamin B12. These diseases include Crohn's disease, chronic pancreatitis, and cystic fibrosis. A condition in which the body does not make enough of a protein (intrinsic factor), resulting in too few red blood cells (pernicious anemia). Having a surgery in which part of the stomach or small intestine is removed. Taking certain medicines that make it hard for the body to absorb vitamin B12. These medicines include: Heartburn medicines (antacids and proton pump inhibitors). Certain antibiotic medicines. Some medicines that are used to treat diabetes, tuberculosis, gout, or high cholesterol. What increases the risk? The following factors may make you more likely to develop a B12 deficiency: Being older than age 41. Eating a vegetarian or vegan diet, especially while you are pregnant. Eating a poor diet while you are pregnant. Taking certain medicines. Having alcoholism. What are the signs or symptoms? In some cases, there are no symptoms of this condition. If the condition leads to anemia or nerve damage, various symptoms can occur, such as: Weakness. Fatigue. Loss of appetite. Weight loss. Numbness or tingling in your hands and feet. Redness and burning of the tongue. Confusion or memory problems. Depression. Sensory problems, such as color blindness, ringing in the ears, or loss of taste. Diarrhea or constipation. Trouble walking. If anemia is severe, symptoms can include: Shortness of breath. Dizziness. Rapid heart rate (tachycardia). How is this diagnosed? This condition may be diagnosed with a blood  test to measure the level of vitamin B12 in your blood. You may also have other tests, including: A group of tests that measure certain characteristics of blood cells (complete blood count, CBC). A blood test to measure intrinsic factor. A procedure where a thin tube with a camera on the end is used to look into your stomach or intestines (endoscopy). Other tests may be needed to discover the cause of B12 deficiency. How is this treated? Treatment for this condition depends on the cause. This condition may be treated by: Changing your eating and drinking habits, such as: Eating more foods that contain vitamin B12. Drinking less alcohol or no alcohol. Getting vitamin B12 injections. Taking vitamin B12 supplements. Your health care provider will tell you which dosage is best for you. Follow these instructions at home: Eating and drinking  Eat lots  of healthy foods that contain vitamin B12, including: Meats and poultry. This includes beef, pork, chicken, [place], and organ meats, such as liver. Seafood. This includes clams, rainbow trout, salmon, tuna, and haddock. Eggs. Cereal and dairy products that are fortified. This means that vitamin B12 has been added to the food. Check the label on the package to see if the food is fortified. The items listed above may not be a complete list of recommended foods and beverages. Contact a dietitian for more information. General instructions Get any injections that are prescribed by your health care provider. Take supplements only as told by your health care provider. Follow the directions carefully. Do not drink alcohol if your health care provider tells you not to. In some cases, you may only be asked to limit alcohol use. Keep all follow-up visits as told by your health care provider. This is important. Contact a health care provider if: Your symptoms come back. Get help right away if you: Develop shortness of breath. Have a rapid heart rate. Have  chest pain. Become dizzy or lose consciousness. Summary Vitamin B12 deficiency occurs when the body does not have enough vitamin B12. The main causes of vitamin B12 deficiency include dietary deficiency, digestive diseases, pernicious anemia, and having a surgery in which part of the stomach or small intestine is removed. In some cases, there are no symptoms of this condition. If the condition leads to anemia or nerve damage, various symptoms can occur, such as weakness, shortness of breath, and numbness. Treatment may include getting vitamin B12 injections or taking vitamin B12 supplements. Eat lots of healthy foods that contain vitamin B12. This information is not intended to replace advice given to you by your health care provider. Make sure you discuss any questions you have with your health care provider. Document Revised: 12/10/2020 Document Reviewed: 02/21/2018 Elsevier Patient Education  2022 [employer].

## 2021-05-18 NOTE — Progress Notes (Signed)
Subjective:    Patient ID: Jasmin Henry, female    DOB: [birth year], 40 y.o.   MRN: [id number]  HPI 40 yo female in non acute distress, presents today for annual physical. Works at [employer].  OB/GYN [facility].  Right sided neck and shoulder, shooting pain into elbow, shoulder and into neck. Anxiety /Depression not taking Wellbutrin, 300mg  XL Family Hx  Grandmother paternal  deceased at  20 during cardiac bypass surgery Maternal uterine and ovarian.   Blood pressure 120/72, pulse 97, temperature 99.2 F (37.3 C), temperature source Tympanic, resp. rate 18, height 5\' 4"  (1.626 m), weight 167 lb 12.8 oz (76.1 kg), SpO2 100 %. Allergies  Allergen Reactions   Sulfa Antibiotics Nausea And Vomiting   Current Outpatient Medications on File Prior to Visit  Medication Sig Dispense Refill   buPROPion (WELLBUTRIN XL) 300 MG 24 hr tablet Take 300 mg by mouth every morning.     Adapalene (DIFFERIN) 0.3 % gel Apply 1 application topically at bedtime. qhs to face and back for acne 45 g 11   cholecalciferol (VITAMIN D) 1000 units tablet Take 1,000 Units by mouth daily. Take 4 gummies daily     Dapsone 5 % topical gel Apply 1 application topically in the morning. qam to face and back for acne 60 g 11   doxycycline (VIBRA-TABS) 100 MG tablet Take 1 tablet (100 mg total) by mouth 2 (two) times daily. (Patient not taking: Reported on 05/18/2021) 14 tablet 0   fexofenadine (ALLEGRA) 180 MG tablet Take 180 mg by mouth daily.     levonorgestrel (MIRENA) 20 MCG/24HR IUD 1 each by Intrauterine route once.     Multiple Vitamin (MULTIVITAMIN) capsule Take 1 capsule by mouth daily.     No current facility-administered medications on file prior to visit.    Past Medical History:  Diagnosis Date   Anxiety    Asthma    SEASONAL ALLERGIES TRIGGER ASTHMA-NO INHALERS   Back pain    Complication of anesthesia    Depression    NO MEDS   PONV (postoperative nausea and vomiting)    AFTER C-SECTION     Past Surgical History:  Procedure Laterality Date   CESAREAN SECTION  DEC 2008   LABIOPLASTY Bilateral 11/28/2015   Procedure: LABIAPLASTY;  Surgeon: Jan 2009, MD;  Location: [facility] ORS;  Service: Gynecology;  Laterality: Bilateral;   WISDOM TOOTH EXTRACTION       Family History  Problem Relation Age of Onset   Hyperlipidemia Mother    Depression Mother    Hypertension Mother    Hyperlipidemia Father    Hypertension Father    Depression Sister    Alcohol abuse Maternal Grandfather    Depression Maternal Grandfather    Hypertension Maternal Grandfather    Hyperlipidemia Paternal Grandmother    Birth defects Paternal Grandmother    Diabetes Paternal Grandmother    Hypertension Paternal Grandmother    Cancer Paternal Grandfather     Review of Systems  Constitutional:  Positive for fatigue.  HENT: Negative.    Eyes: Negative.   Respiratory: Negative.    Cardiovascular: Negative.   Gastrointestinal: Negative.   Endocrine: Negative.   Genitourinary: Negative.   Musculoskeletal:        Right shoulder pain "for months"  Skin: Negative.   Allergic/Immunologic: Positive for environmental allergies. Negative for food allergies.  Neurological: Negative.   Hematological: Negative.   Psychiatric/Behavioral: Negative.         History of depression and  anxiety not taking medication, no recent appointment with psychiatry or therapist.   2 years ago eyes pending eye exam.      Allergies  Allergen Reactions   Sulfa Antibiotics Nausea And Vomiting   Influenza vaccinated Covid vaccinated and boosted  2 Objective:   Physical Exam Vitals and nursing note reviewed. Exam conducted with a chaperone present.  Constitutional:      Appearance: Normal appearance.  HENT:     Head: Normocephalic and atraumatic.     Jaw: There is normal jaw occlusion.     Right Ear: Hearing, tympanic membrane, ear canal and external ear normal.     Left Ear: Hearing, tympanic membrane, ear canal  and external ear normal.     Nose: Nose normal.     Mouth/Throat:     Lips: Pink.     Mouth: Mucous membranes are moist.     Pharynx: Oropharynx is clear.     Tonsils: 0 on the right. 0 on the left.  Eyes:     General: Lids are normal. Vision grossly intact. Gaze aligned appropriately.     Extraocular Movements: Extraocular movements intact.     Conjunctiva/sclera: Conjunctivae normal.     Pupils: Pupils are equal, round, and reactive to light.  Neck:     Thyroid: No thyroid mass, thyromegaly or thyroid tenderness.     Trachea: Trachea and phonation normal.  Cardiovascular:     Rate and Rhythm: Normal rate and regular rhythm.     Pulses: Normal pulses.          Carotid pulses are 2+ on the right side and 2+ on the left side.      Radial pulses are 2+ on the right side and 2+ on the left side.       Dorsalis pedis pulses are 2+ on the right side and 2+ on the left side.       Posterior tibial pulses are 2+ on the right side and 2+ on the left side.     Heart sounds: Normal heart sounds, S1 normal and S2 normal. No murmur heard.   No friction rub. No gallop.  Pulmonary:     Effort: Pulmonary effort is normal.     Breath sounds: Normal breath sounds and air entry.  Abdominal:     General: Abdomen is flat. Bowel sounds are normal.     Palpations: Abdomen is soft.     Tenderness: There is no abdominal tenderness. There is no right CVA tenderness or left CVA tenderness.  Genitourinary:    Rectum: Guaiac result negative. External hemorrhoid present. No mass, tenderness, anal fissure or internal hemorrhoid. Normal anal tone.    Musculoskeletal:        General: Normal range of motion.     Cervical back: Full passive range of motion without pain, normal range of motion and neck supple.     Right lower leg: No edema.     Left lower leg: No edema.  Lymphadenopathy:     Cervical: No cervical adenopathy.  Skin:    General: Skin is warm and dry.     Capillary Refill: Capillary refill  takes less than 2 seconds.  Neurological:     General: No focal deficit present.     Mental Status: She is alert and oriented to person, place, and time.  Psychiatric:        Mood and Affect: Mood normal.        Behavior: Behavior normal. Behavior is cooperative.  Thought Content: Thought content normal.        Judgment: Judgment normal.     Breast OB/GYN Ob/gyn too schedule appointment needs mammogram  Rectal WNL, small non inflamed  hemorrhoid @ 6pm, guiaic negative     Assessment & Plan:  Annual physical exam Vit B12 deficiency 1000 mcg perfers injection Q30 12 doses. Dyslipidemia diet behavioral modification reviewed with patient diet plan. Vit D deficiency Vitamin D3   3000IU/day Right shoulder and trapezius pain  SMR and Massage if not improving will consider, xray cervical and PT. Refill on Wellbutrin XL, encourage patient to see her therapist/psychiatrist for appointment for management of Depression and Anxiety. Encouraged patient to set up OB/GYN appointment for pap smear and well women exam, she will call [facility].Marland Kitchen  Overweight BMI 28.7 Meds ordered this encounter  Medications   cyclobenzaprine (FLEXERIL) 5 MG tablet    Sig: Take 1 tablet (5 mg total) by mouth 3 (three) times daily as needed for muscle spasms.    Dispense:  30 tablet    Refill:  2   buPROPion (WELLBUTRIN XL) 300 MG 24 hr tablet    Sig: Take 1 tablet (300 mg total) by mouth daily.    Dispense:  90 tablet    Refill:  0   cyanocobalamin (,VITAMIN B-12,) 1000 MCG/ML injection    Sig: Inject 1 mL (1,000 mcg total) into the muscle every 30 (thirty) days.    Dispense:  1 mL    Refill:  11   Patient verbalizes understanding and has no questions at discharge. Follow up labs in 6 months,  orders placed in Epic.Follow up in  2 weeks on right shoulder pain if not improving. Patient verbalizes understanding and has no questions at discharge.

## 2021-05-19 ENCOUNTER — Encounter: Payer: Self-pay | Admitting: Medical

## 2021-05-20 MED ORDER — CYANOCOBALAMIN 1000 MCG/ML IJ SOLN: 1000.0000 ug | INTRAMUSCULAR | 11 refills | Status: DC

## 2021-05-26 ENCOUNTER — Encounter: Payer: Self-pay | Admitting: Medical

## 2021-08-13 ENCOUNTER — Other Ambulatory Visit: Payer: Self-pay | Admitting: Medical

## 2021-08-13 DIAGNOSIS — F419 Anxiety disorder, unspecified: Secondary | ICD-10-CM

## 2021-08-13 DIAGNOSIS — F32A Depression, unspecified: Secondary | ICD-10-CM

## 2021-11-21 ENCOUNTER — Other Ambulatory Visit: Payer: Self-pay | Admitting: Nurse Practitioner

## 2021-11-21 DIAGNOSIS — F32A Depression, unspecified: Secondary | ICD-10-CM

## 2022-01-06 ENCOUNTER — Encounter: Payer: Self-pay | Admitting: Medical

## 2022-02-03 ENCOUNTER — Encounter: Payer: Self-pay | Admitting: Nurse Practitioner

## 2022-04-06 ENCOUNTER — Ambulatory Visit (INDEPENDENT_AMBULATORY_CARE_PROVIDER_SITE_OTHER): Payer: Self-pay | Admitting: Physician Assistant

## 2022-04-06 ENCOUNTER — Encounter: Payer: Self-pay | Admitting: Physician Assistant

## 2022-04-06 VITALS — BP 120/78 | HR 74 | Temp 98.4°F | Wt 164.0 lb

## 2022-04-06 DIAGNOSIS — E559 Vitamin D deficiency, unspecified: Secondary | ICD-10-CM

## 2022-04-06 DIAGNOSIS — H01133 Eczematous dermatitis of right eye, unspecified eyelid: Secondary | ICD-10-CM

## 2022-04-06 DIAGNOSIS — R5383 Other fatigue: Secondary | ICD-10-CM

## 2022-04-06 MED ORDER — DESONIDE 0.05 % EX CREA: TOPICAL_CREAM | Freq: Two times a day (BID) | CUTANEOUS | 1 refills | Status: AC

## 2022-04-06 NOTE — Progress Notes (Signed)
[facility] [address], [place] [address]   Office Visit Note  Patient Name: Jasmin Henry Date of Birth [id number]  Medical Record number [id number]  Date of Service: 04/06/2022  Chief Complaint  Patient presents with   Eye Problem    Redness around right eye. Started 5 weeks ago. Burns on the skin. Pt stated she is also very tired. That also started the same time.     40 y/o F presents to the clinic for c/o a rash around the bottom R eyelid extending into the R cheek x 5 weeks. She states she has noticed "burning" sensation over the concerned area with wind blowing or when her husband turns fan on at night. She denies itching over the rash area, however she takes Allegra daily for allergies. She does have h/o eczema for which she has Triamcinolone cream and ointment for which she has used and has noticed an improvement of the rash temporarily. She denies any changes in hygiene products including soaps, lotions, shampoos, laundry detergents. No changes in otc medicines or diet.  Other complaint includes fatigue x5 weeks. Admits to feeling very sleepy in the afternoon. No changes in medicines. +anxiety/depression, but not more than usual. She wakes up multiple times through the night. She admits to snoring. Hasn't been tested for sleep apnea. Has h/o low vitamin D and currently takes otc 5,000IUs of Vitamin D3 twice a week. +h/o anemia, not on iron pills. She has tried sleep hygiene at night, but hasn't helped her daytime sleepiness.       Current Medication:  Outpatient Encounter Medications as of 04/06/2022  Medication Sig   cholecalciferol (VITAMIN D) 1000 units tablet Take 1,000 Units by mouth daily. Take 4 gummies daily   cyanocobalamin (,VITAMIN B-12,) 1000 MCG/ML injection Inject 1 mL (1,000 mcg total) into the muscle every 30 (thirty) days.   desonide (DESOWEN) 0.05 % cream Apply topically 2 (two) times daily for 14 days. To the affected area    fexofenadine (ALLEGRA) 180 MG tablet Take 180 mg by mouth daily.   levonorgestrel (MIRENA) 20 MCG/24HR IUD 1 each by Intrauterine route once.   Multiple Vitamin (MULTIVITAMIN) capsule Take 1 capsule by mouth daily.   Adapalene (DIFFERIN) 0.3 % gel Apply 1 application topically at bedtime. qhs to face and back for acne (Patient not taking: Reported on 04/06/2022)   buPROPion (WELLBUTRIN XL) 300 MG 24 hr tablet TAKE 1 TABLET BY MOUTH EVERY DAY (Patient not taking: Reported on 04/06/2022)   cyclobenzaprine (FLEXERIL) 5 MG tablet Take 1 tablet (5 mg total) by mouth 3 (three) times daily as needed for muscle spasms. (Patient not taking: Reported on 04/06/2022)   Dapsone 5 % topical gel Apply 1 application topically in the morning. qam to face and back for acne (Patient not taking: Reported on 04/06/2022)   No facility-administered encounter medications on file as of 04/06/2022.      Medical History: Past Medical History:  Diagnosis Date   Anxiety    Asthma    SEASONAL ALLERGIES TRIGGER ASTHMA-NO INHALERS   Back pain    Complication of anesthesia    Depression    NO MEDS   PONV (postoperative nausea and vomiting)    AFTER C-SECTION     Vital Signs: BP 120/78 (BP Location: Left Arm, Patient Position: Sitting, Cuff Size: Normal)   Pulse 74   Temp 98.4 F (36.9 C) (Tympanic)   Wt 164 lb (74.4 kg)   SpO2 98%  BMI 28.15 kg/m    Review of Systems  Constitutional:  Positive for fatigue. Negative for activity change, appetite change and chills.  HENT: Negative.    Respiratory: Negative.    Cardiovascular: Negative.   Gastrointestinal: Negative.   Skin:  Positive for rash.  Neurological: Negative.   Psychiatric/Behavioral:  Positive for sleep disturbance. Negative for self-injury and suicidal ideas. The patient is nervous/anxious. The patient is not hyperactive.     Physical Exam Constitutional:      Appearance: Normal appearance.  HENT:     Right Ear: External ear normal.      Left Ear: External ear normal.  Eyes:     Extraocular Movements: Extraocular movements intact.  Cardiovascular:     Rate and Rhythm: Normal rate and regular rhythm.     Pulses: Normal pulses.  Pulmonary:     Effort: Pulmonary effort is normal.     Breath sounds: Normal breath sounds.  Skin:         Comments: Right lower eyelid: Macular erythematous rash with slight scaling and raised lesion.  Neurological:     Mental Status: She is alert and oriented to person, place, and time.  Psychiatric:        Mood and Affect: Mood normal.        Behavior: Behavior normal.        Judgment: Judgment normal.       Assessment/Plan:  1. Atopic dermatitis of eyelid, right - desonide (DESOWEN) 0.05 % cream; Apply topically 2 (two) times daily for 14 days. To the affected area  Dispense: 15 g; Refill: 1 - Ambulatory referral to Dermatology  2. Fatigue, unspecified type - CBC w/Diff - TSH + free T4 - Iron, TIBC and Ferritin Panel - VITAMIN D 25 Hydroxy (Vit-D Deficiency, Fractures)  3. Vitamin D insufficiency - VITAMIN D 25 Hydroxy (Vit-D Deficiency, Fractures)  Discussed my clinical findings with patient. She verbalized understanding.  I suspect exacerbation of eczema.  Will try a low dose potent corticosteroid cream because of the location on the face. Will also send a referral to Dermatologist for further evaluation For her fatigue symptoms will do blood work to r/o any underlying causation. If blood work normal then possibly sleep apnea and may require a referral to sleep study.  Patient will need to get established with a PCP in the community and follow up. We went over sleep hygiene ie soft music, warm showers, yoga, meditation, etc.  Patient was set up with a community PCP for 06/07/2022 at [facility].  She was also given contact information for [facility] to schedule an appointment.   General Counseling: Joniqua verbalizes understanding of the  findings of todays visit and agrees with plan of treatment. I have discussed any further diagnostic evaluation that may be needed or ordered today. We also reviewed her medications today. she has been encouraged to call the office with any questions or concerns that should arise related to todays visit.    Time spent:[address] [place], [place] Physician Assistant

## 2022-04-07 LAB — CBC WITH DIFFERENTIAL/PLATELET
Basophils Absolute: 0.1 10*3/uL (ref 0.0–0.2)
Basos: 2 %
EOS (ABSOLUTE): 0.1 10*3/uL (ref 0.0–0.4)
Eos: 1 %
Hematocrit: 43 % (ref 34.0–46.6)
Hemoglobin: 14 g/dL (ref 11.1–15.9)
Immature Grans (Abs): 0 10*3/uL (ref 0.0–0.1)
Immature Granulocytes: 0 %
Lymphocytes Absolute: 1.4 10*3/uL (ref 0.7–3.1)
Lymphs: 26 %
MCH: 30.5 pg (ref 26.6–33.0)
MCHC: 32.6 g/dL (ref 31.5–35.7)
MCV: 94 fL (ref 79–97)
Monocytes Absolute: 0.3 10*3/uL (ref 0.1–0.9)
Monocytes: 6 %
Neutrophils Absolute: 3.7 10*3/uL (ref 1.4–7.0)
Neutrophils: 65 %
Platelets: 404 10*3/uL (ref 150–450)
RBC: 4.59 x10E6/uL (ref 3.77–5.28)
RDW: 12.6 % (ref 11.7–15.4)
WBC: 5.6 10*3/uL (ref 3.4–10.8)

## 2022-04-07 LAB — IRON,TIBC AND FERRITIN PANEL
Ferritin: 128 ng/mL (ref 15–150)
Iron Saturation: 34 % (ref 15–55)
Iron: 116 ug/dL (ref 27–159)
Total Iron Binding Capacity: 340 ug/dL (ref 250–450)
UIBC: 224 ug/dL (ref 131–425)

## 2022-04-07 LAB — TSH+FREE T4
Free T4: 1.24 ng/dL (ref 0.82–1.77)
TSH: 1.36 u[IU]/mL (ref 0.450–4.500)

## 2022-04-07 LAB — VITAMIN D 25 HYDROXY (VIT D DEFICIENCY, FRACTURES): Vit D, 25-Hydroxy: 32 ng/mL (ref 30.0–100.0)

## 2022-04-09 ENCOUNTER — Telehealth: Payer: Self-pay | Admitting: Physician Assistant

## 2022-04-09 NOTE — Telephone Encounter (Signed)
Spoke to patient regarding her blood work and if she hand any questions. She is aware of PCP appointment. She has the contact information for Dermatologist to schedule an appointment.  Pt admits that her rash is improving around her R eye. Low dose steroid cream helping. Will f/u with Dermatologist if not improving or worse.

## 2022-04-21 ENCOUNTER — Encounter: Payer: Self-pay | Admitting: Physician Assistant

## 2022-06-07 ENCOUNTER — Ambulatory Visit: Payer: BC Managed Care – PPO | Admitting: Family

## 2022-07-14 ENCOUNTER — Ambulatory Visit: Payer: BC Managed Care – PPO | Admitting: Family

## 2022-11-15 ENCOUNTER — Ambulatory Visit: Payer: Self-pay | Admitting: Physician Assistant

## 2022-11-15 VITALS — BP 110/68 | HR 87 | Temp 97.1°F | Resp 16 | Wt 169.0 lb

## 2022-11-15 DIAGNOSIS — L299 Pruritus, unspecified: Secondary | ICD-10-CM

## 2022-11-15 DIAGNOSIS — L237 Allergic contact dermatitis due to plants, except food: Secondary | ICD-10-CM

## 2022-11-15 MED ORDER — METHYLPREDNISOLONE SODIUM SUCC 125 MG IJ SOLR
125.0000 mg | Freq: Once | INTRAMUSCULAR | Status: AC
  Administered 2022-11-15: 125 mg via INTRAMUSCULAR

## 2022-11-15 MED ORDER — HYDROXYZINE PAMOATE 25 MG PO CAPS: 25.0000 mg | ORAL_CAPSULE | Freq: Three times a day (TID) | ORAL | 0 refills | Status: DC | PRN

## 2022-11-15 NOTE — Progress Notes (Signed)
[facility] [address] [place], [place] [address]   Office Visit Note  Patient Name: Jasmin Henry Date of Birth [id number]  Medical Record number [id number]  Date of Service: 11/15/2022  Chief Complaint  Patient presents with   Poison Ivy     42 y/o F presents to the clinic for c/o a rash after working in the garden 10 days ago. Noticed poison ivy in her yard. She immediately washed it, however over the past few days the poison ivy has spread around her body. Arms, inner thighs, abdomen areas. Has been applying Triamcinalone cream daily and using Ketoconazole cream as well as Zyrtec and Benadryl to help with itching.   Poison Ivy      Current Medication:  Outpatient Encounter Medications as of 11/15/2022  Medication Sig   hydrOXYzine (VISTARIL) 25 MG capsule Take 1 capsule (25 mg total) by mouth every 8 (eight) hours as needed.   Adapalene (DIFFERIN) 0.3 % gel Apply 1 application topically at bedtime. qhs to face and back for acne (Patient not taking: Reported on 04/06/2022)   buPROPion (WELLBUTRIN XL) 300 MG 24 hr tablet TAKE 1 TABLET BY MOUTH EVERY DAY (Patient not taking: Reported on 04/06/2022)   cholecalciferol (VITAMIN D) 1000 units tablet Take 1,000 Units by mouth daily. Take 4 gummies daily   cyanocobalamin (,VITAMIN B-12,) 1000 MCG/ML injection Inject 1 mL (1,000 mcg total) into the muscle every 30 (thirty) days.   cyclobenzaprine (FLEXERIL) 5 MG tablet Take 1 tablet (5 mg total) by mouth 3 (three) times daily as needed for muscle spasms. (Patient not taking: Reported on 04/06/2022)   Dapsone 5 % topical gel Apply 1 application topically in the morning. qam to face and back for acne (Patient not taking: Reported on 04/06/2022)   fexofenadine (ALLEGRA) 180 MG tablet Take 180 mg by mouth daily.   levonorgestrel (MIRENA) 20 MCG/24HR IUD 1 each by Intrauterine route once.   Multiple Vitamin (MULTIVITAMIN) capsule Take 1 capsule by mouth daily.   [EXPIRED]  methylPREDNISolone sodium succinate (SOLU-MEDROL) 125 mg/2 mL injection 125 mg    No facility-administered encounter medications on file as of 11/15/2022.      Medical History: Past Medical History:  Diagnosis Date   Anxiety    Asthma    SEASONAL ALLERGIES TRIGGER ASTHMA-NO INHALERS   Back pain    Complication of anesthesia    Depression    NO MEDS   PONV (postoperative nausea and vomiting)    AFTER C-SECTION     Vital Signs: BP 110/68 (BP Location: Left Arm, Patient Position: Sitting, Cuff Size: Normal)   Pulse 87   Temp (!) 97.1 F (36.2 C) (Tympanic)   Resp 16   Wt 169 lb (76.7 kg)   SpO2 98%   BMI 29.01 kg/m    Review of Systems  Constitutional: Negative.   Skin:  Positive for rash.    Physical Exam Constitutional:      Appearance: Normal appearance.  HENT:     Head: Normocephalic and atraumatic.     Right Ear: External ear normal.     Left Ear: External ear normal.  Eyes:     Extraocular Movements: Extraocular movements intact.  Skin:    General: Skin is warm and dry.       Neurological:     Mental Status: She is alert and oriented to person, place, and time.  Psychiatric:        Mood and Affect: Mood normal.  Behavior: Behavior normal.       Assessment/Plan:  1. Poison ivy dermatitis - methylPREDNISolone sodium succinate (SOLU-MEDROL) 125 mg/2 mL injection 125 mg  2. Pruritus - hydrOXYzine (VISTARIL) 25 MG capsule; Take 1 capsule (25 mg total) by mouth every 8 (eight) hours as needed.  Dispense: 30 capsule; Refill: 0  Apply ice. Avoid hot showers Continue with Zyrtec  Take Hydroxyzine as prescribed for itching Use otc Sarna to help with itching Continue to watch for worsening symptoms PT will contact me if symptoms don't improve then will send a high dose Prednisone Rx to preferred pharmacy. Pt verbalized understanding and in agreement.    General Counseling: Cythina verbalizes understanding of the findings of todays visit and  agrees with plan of treatment. I have discussed any further diagnostic evaluation that may be needed or ordered today. We also reviewed her medications today. she has been encouraged to call the office with any questions or concerns that should arise related to todays visit.    Time spent:20 Minutes    Gilberto Better, [place] Physician Assistant

## 2023-03-31 ENCOUNTER — Encounter: Payer: Self-pay | Admitting: Adult Health

## 2023-03-31 ENCOUNTER — Other Ambulatory Visit: Payer: Self-pay

## 2023-03-31 ENCOUNTER — Ambulatory Visit: Payer: Self-pay | Admitting: Adult Health

## 2023-03-31 VITALS — BP 120/80 | HR 103 | Temp 98.7°F | Ht 63.0 in | Wt 170.0 lb

## 2023-03-31 DIAGNOSIS — R45851 Suicidal ideations: Secondary | ICD-10-CM

## 2023-03-31 DIAGNOSIS — R5383 Other fatigue: Secondary | ICD-10-CM

## 2023-03-31 DIAGNOSIS — F3289 Other specified depressive episodes: Secondary | ICD-10-CM

## 2023-03-31 NOTE — Progress Notes (Signed)
[facility] [address] [place], [place] [address]   Office Visit Note  Patient Name: Jasmin Henry Date of Birth [id number]  Medical Record number [id number]  Date of Service: 04/01/2023  Chief Complaint  Patient presents with   Acute Visit    Patient reports having suicidal ideation with plan and volatile mood swings. The thoughts have been present at least 3 times in the last 10 days. She states this is not normal for her and she is concerned it may be hormone related.      HPI Pt is here for a sick visit. She reports a history of Depression since the age of 57. She was on Wellbutrin, pre-2020.  She was told her Anxiety was moderate and her depression was mild. She has been able to manage well without medications.  She reports about 11 days ago she had a hard time, and actively had SI.  She stayed in bed for about 24 hours, and had a plan.  She states "I knew there were razor blades in my bathroom, and I could go in there and do it"  She reports what kept her from doing it was "knowing my kids could come in and find me, and I dont' want to traumatize them" They are 47 and 43 years old.  She states those feelings went away since then, and she is currently no having SI or HI.  She is well in tune with resources and knows that she needs to see a psychiatrist for evaluation.  She was previously a patient of Dr. Arvilla Market, but has not seen her in many years. She has counseling session scheduled for tonight with her husband who is very supportive. She is not sure what has triggered this currently. She had a situational depressive episode when she was 54, and had a suicide attempt.  She has not had any more attempts.  She reports a history of low vit D, and B12, and she is concerned about perimenopause. She does acknowledge some new stressors at work.      Current Medication:  Outpatient Encounter Medications as of 03/31/2023  Medication Sig   Adapalene (DIFFERIN) 0.3 % gel Apply 1  application topically at bedtime. qhs to face and back for acne   cholecalciferol (VITAMIN D) 1000 units tablet Take 1,000 Units by mouth daily. Take 4 gummies daily   Dapsone 5 % topical gel Apply 1 application topically in the morning. qam to face and back for acne (Patient taking differently: Apply 1 application  topically as needed. qam to face and back for acne)   fexofenadine (ALLEGRA) 180 MG tablet Take 180 mg by mouth daily as needed.   fluticasone (FLONASE) 50 MCG/ACT nasal spray Place 2 sprays into both nostrils daily.   levonorgestrel (MIRENA) 20 MCG/24HR IUD 1 each by Intrauterine route once.   [DISCONTINUED] hydrOXYzine (VISTARIL) 25 MG capsule Take 1 capsule (25 mg total) by mouth every 8 (eight) hours as needed.   [DISCONTINUED] Multiple Vitamin (MULTIVITAMIN) capsule Take 1 capsule by mouth daily.   No facility-administered encounter medications on file as of 03/31/2023.      Medical History: Past Medical History:  Diagnosis Date   Anxiety    Asthma    SEASONAL ALLERGIES TRIGGER ASTHMA-NO INHALERS   Back pain    Complication of anesthesia    Depression    NO MEDS   PONV (postoperative nausea and vomiting)    AFTER C-SECTION     Vital Signs:  BP 120/80   Pulse (!) 103   Temp 98.7 F (37.1 C)   Ht 5\' 3"  (1.6 m)   Wt 170 lb (77.1 kg)   SpO2 98%   BMI 30.11 kg/m    Review of Systems  Constitutional:  Negative for chills, fatigue and fever.  Psychiatric/Behavioral:  Positive for suicidal ideas. Negative for agitation, behavioral problems, confusion, dysphoric mood, hallucinations, self-injury and sleep disturbance. The patient is not nervous/anxious and is not hyperactive.     Physical Exam Vitals and nursing note reviewed.  Constitutional:      General: She is not in acute distress.    Appearance: Normal appearance. She is not ill-appearing.  HENT:     Head: Normocephalic.  Neurological:     General: No focal deficit present.     Mental Status: She is  alert and oriented to person, place, and time.  Psychiatric:        Mood and Affect: Mood normal.        Behavior: Behavior normal.        Thought Content: Thought content normal.        Judgment: Judgment normal.    Assessment/Plan: 1. Other depression Evaluate labs when available, and refer to psych to re-establish care.  - Ambulatory referral to Psychiatry - Ferritin - Iron and TIBC - Comprehensive metabolic panel - CBC with Differential/Platelet - B12 and Folate Panel - VITAMIN D 25 Hydroxy (Vit-D Deficiency, Fractures) - TSH - FSH - LH  2. Suicidal ideation Resolved at this time.  Discussed Resources and options for care.  She is well aware, has a good support system.  - Ambulatory referral to Psychiatry  3. Other fatigue - Ferritin - Iron and TIBC - Comprehensive metabolic panel - CBC with Differential/Platelet - B12 and Folate Panel - VITAMIN D 25 Hydroxy (Vit-D Deficiency, Fractures) - TSH - FSH - LH     General Counseling: Charmane verbalizes understanding of the findings of todays visit and agrees with plan of treatment. I have discussed any further diagnostic evaluation that may be needed or ordered today. We also reviewed her medications today. she has been encouraged to call the office with any questions or concerns that should arise related to todays visit.   Orders Placed This Encounter  Procedures   Ferritin   Iron and TIBC   Comprehensive metabolic panel   CBC with Differential/Platelet   B12 and Folate Panel   VITAMIN D 25 Hydroxy (Vit-D Deficiency, Fractures)   TSH   FSH   LH   Ambulatory referral to Psychiatry    No orders of the defined types were placed in this encounter.   Time spent:20 Minutes    Johnna Acosta AGNP-C Nurse Practitioner

## 2023-04-01 ENCOUNTER — Encounter: Payer: Self-pay | Admitting: Adult Health

## 2023-04-02 LAB — COMPREHENSIVE METABOLIC PANEL
ALT: 18 [IU]/L (ref 0–32)
AST: 17 [IU]/L (ref 0–40)
Albumin: 4.6 g/dL (ref 3.9–4.9)
Alkaline Phosphatase: 96 [IU]/L (ref 44–121)
BUN/Creatinine Ratio: 13 (ref 9–23)
BUN: 10 mg/dL (ref 6–24)
Bilirubin Total: 0.3 mg/dL (ref 0.0–1.2)
CO2: 22 mmol/L (ref 20–29)
Calcium: 9.6 mg/dL (ref 8.7–10.2)
Chloride: 100 mmol/L (ref 96–106)
Creatinine, Ser: 0.76 mg/dL (ref 0.57–1.00)
Globulin, Total: 3.1 g/dL (ref 1.5–4.5)
Glucose: 86 mg/dL (ref 70–99)
Potassium: 4.8 mmol/L (ref 3.5–5.2)
Sodium: 136 mmol/L (ref 134–144)
Total Protein: 7.7 g/dL (ref 6.0–8.5)
eGFR: 100 mL/min/{1.73_m2} (ref >59)

## 2023-04-02 LAB — CBC WITH DIFFERENTIAL/PLATELET
Basophils Absolute: 0.1 10*3/uL (ref 0.0–0.2)
Basos: 1 %
EOS (ABSOLUTE): 0.1 10*3/uL (ref 0.0–0.4)
Eos: 2 %
Hematocrit: 42.2 % (ref 34.0–46.6)
Hemoglobin: 13.7 g/dL (ref 11.1–15.9)
Immature Grans (Abs): 0 10*3/uL (ref 0.0–0.1)
Immature Granulocytes: 0 %
Lymphocytes Absolute: 1.9 10*3/uL (ref 0.7–3.1)
Lymphs: 25 %
MCH: 30.8 pg (ref 26.6–33.0)
MCHC: 32.5 g/dL (ref 31.5–35.7)
MCV: 95 fL (ref 79–97)
Monocytes Absolute: 0.7 10*3/uL (ref 0.1–0.9)
Monocytes: 9 %
Neutrophils Absolute: 4.7 10*3/uL (ref 1.4–7.0)
Neutrophils: 63 %
Platelets: 383 10*3/uL (ref 150–450)
RBC: 4.45 x10E6/uL (ref 3.77–5.28)
RDW: 12.8 % (ref 11.7–15.4)
WBC: 7.5 10*3/uL (ref 3.4–10.8)

## 2023-04-02 LAB — LUTEINIZING HORMONE: LH: 13.7 m[IU]/mL

## 2023-04-02 LAB — FOLLICLE STIMULATING HORMONE: FSH: 5.8 m[IU]/mL

## 2023-04-02 LAB — IRON AND TIBC
Iron Saturation: 27 % (ref 15–55)
Iron: 97 ug/dL (ref 27–159)
Total Iron Binding Capacity: 358 ug/dL (ref 250–450)
UIBC: 261 ug/dL (ref 131–425)

## 2023-04-02 LAB — B12 AND FOLATE PANEL
Folate: 9 ng/mL (ref >3.0)
Vitamin B-12: 236 pg/mL (ref 232–1245)

## 2023-04-02 LAB — VITAMIN D 25 HYDROXY (VIT D DEFICIENCY, FRACTURES): Vit D, 25-Hydroxy: 33.2 ng/mL (ref 30.0–100.0)

## 2023-04-02 LAB — FERRITIN: Ferritin: 137 ng/mL (ref 15–150)

## 2023-04-02 LAB — TSH: TSH: 2.04 u[IU]/mL (ref 0.450–4.500)

## 2023-04-12 ENCOUNTER — Other Ambulatory Visit: Payer: Self-pay | Admitting: Medical

## 2023-04-12 DIAGNOSIS — Z1231 Encounter for screening mammogram for malignant neoplasm of breast: Secondary | ICD-10-CM

## 2023-04-19 ENCOUNTER — Ambulatory Visit
Admission: RE | Admit: 2023-04-19 | Discharge: 2023-04-19 | Disposition: A | Discharge status: Home / Self Care | Payer: BC Managed Care – PPO | Source: Ambulatory Visit | Attending: Medical | Admitting: Medical

## 2023-04-19 DIAGNOSIS — Z1231 Encounter for screening mammogram for malignant neoplasm of breast: Secondary | ICD-10-CM

## 2023-05-09 ENCOUNTER — Other Ambulatory Visit: Payer: Self-pay | Admitting: Adult Health

## 2023-05-09 ENCOUNTER — Encounter: Payer: Self-pay | Admitting: Adult Health

## 2023-05-09 DIAGNOSIS — L299 Pruritus, unspecified: Secondary | ICD-10-CM

## 2023-05-09 MED ORDER — FLUOCINOLONE ACETONIDE BODY 0.01 % EX OIL
1.0000 | TOPICAL_OIL | Freq: Two times a day (BID) | CUTANEOUS | 2 refills | Status: DC
Start: 2023-05-09 — End: 2024-01-03

## 2023-05-24 ENCOUNTER — Ambulatory Visit: Payer: BC Managed Care – PPO | Admitting: Psychiatry

## 2023-05-24 ENCOUNTER — Encounter: Payer: Self-pay | Admitting: Psychiatry

## 2023-05-24 ENCOUNTER — Other Ambulatory Visit: Payer: Self-pay

## 2023-05-24 VITALS — BP 129/80 | HR 88 | Temp 97.7°F | Ht 63.0 in | Wt 168.4 lb

## 2023-05-24 DIAGNOSIS — F411 Generalized anxiety disorder: Secondary | ICD-10-CM | POA: Diagnosis not present

## 2023-05-24 DIAGNOSIS — R41840 Attention and concentration deficit: Secondary | ICD-10-CM | POA: Insufficient documentation

## 2023-05-24 DIAGNOSIS — F331 Major depressive disorder, recurrent, moderate: Secondary | ICD-10-CM | POA: Diagnosis not present

## 2023-05-24 MED ORDER — VENLAFAXINE HCL ER 37.5 MG PO CP24
37.5000 mg | ORAL_CAPSULE | Freq: Every day | ORAL | 1 refills | Status: DC
Start: 2023-05-24 — End: 2023-06-16

## 2023-05-24 MED ORDER — VENLAFAXINE HCL ER 37.5 MG PO CP24
37.5000 mg | ORAL_CAPSULE | Freq: Every day | ORAL | 0 refills | Status: DC
Start: 2023-05-24 — End: 2023-08-25
  Filled 2023-05-24: qty 30, 30d supply, fill #0
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
## 2023-05-24 NOTE — Patient Instructions (Signed)
Jasmin Henry - [id number].  Venlafaxine Extended-Release Capsules What is this medication? VENLAFAXINE (VEN la fax een) treats depression and anxiety. It increases the amount of serotonin and norepinephrine in the brain, hormones that help regulate mood. It belongs to a group of medications called SNRIs. This medicine may be used for other purposes; ask your health care provider or pharmacist if you have questions. COMMON BRAND NAME(S): Effexor XR What should I tell my care team before I take this medication? They need to know if you have any of these conditions: Bleeding disorders Glaucoma Heart disease High blood pressure High cholesterol Kidney disease Liver disease Low levels of sodium in the blood Mania or bipolar disorder Seizures Suicidal thoughts, plans, or attempt by you or a family member Take medications that treat or prevent blood clots Thyroid disease An unusual or allergic reaction to venlafaxine, other medications, foods, dyes, or preservatives Pregnant or trying to get pregnant Breastfeeding How should I use this medication? Take this medication by mouth with a full glass of water. Take it as directed on the prescription label. Do not cut, crush, or chew this medication. Take it with food. You may open the capsule and put the contents in 1 teaspoon of applesauce. Swallow the medication and applesauce right away. Do not chew the medication or applesauce. Follow with a glass of water to ensure complete swallowing of the pellets. Try to take your medication at about the same time each day. Do not take your medication more often than directed. Keep taking this medication unless your care team tells you to stop. Stopping it too quickly can cause serious side effects. It can also make your condition worse. A special MedGuide will be given to you by the pharmacist with each prescription and refill. Be sure to read this information carefully each time. Talk to your care team about  the use of this medication in children. Special care may be needed. Overdosage: If you think you have taken too much of this medicine contact a poison control center or emergency room at once. NOTE: This medicine is only for you. Do not share this medicine with others. What if I miss a dose? If you miss a dose, take it as soon as you can. If it is almost time for your next dose, take only that dose. Do not take double or extra doses. What may interact with this medication? Do not take this medication with any of the following: Alcohol Certain medications for fungal infections, such as fluconazole, itraconazole, ketoconazole, posaconazole, voriconazole Cisapride Desvenlafaxine Dronedarone Duloxetine Levomilnacipran Linezolid MAOIs, such as Carbex, Eldepryl, Marplan, Nardil, and Parnate Methylene blue (injected into a vein) Milnacipran Pimozide Thioridazine This medication may also interact with the following: Amphetamines Aspirin and aspirin-like medications Certain medications for mental health conditions Certain medications for migraine headaches, such as almotriptan, eletriptan, frovatriptan, naratriptan, rizatriptan, sumatriptan, zolmitriptan Certain medications for sleep Certain medications that treat or prevent blood clots, such as dalteparin, enoxaparin, warfarin Cimetidine Clozapine Diuretics Fentanyl Furazolidone Indinavir Isoniazid Lithium Metoprolol NSAIDS, medications for pain and inflammation, such as ibuprofen or naproxen Other medications that cause heart rhythm changes Procarbazine Rasagiline Supplements, such as St. John's wort, kava kava, valerian Tramadol Tryptophan This list may not describe all possible interactions. Give your health care provider a list of all the medicines, herbs, non-prescription drugs, or dietary supplements you use. Also tell them if you smoke, drink alcohol, or use illegal drugs. Some items may interact with your medicine. What  should I watch for  while using this medication? Tell your care team if your symptoms do not get better or if they get worse. Visit your care team for regular checks on your progress. Because it may take several weeks to see the full effects of this medication, it is important to continue your treatment as prescribed by your care team. Watch for new or worsening thoughts of suicide or depression. This includes sudden changes in mood, behaviors, or thoughts. These changes can happen at any time but are more common in the beginning of treatment or after a change in dose. Call your care team right away if you experience these thoughts or worsening depression. This medication may cause mood and behavior changes, such as anxiety, nervousness, irritability, hostility, restlessness, excitability, hyperactivity, or trouble sleeping. These changes can happen at any time but are more common in the beginning of treatment or after a change in dose. Call your care team right away if you notice any of these symptoms. This medication can cause an increase in blood pressure. Check with your care team for instructions on monitoring your blood pressure while taking this medication. This medication may affect your coordination, reaction time, or judgment. Do not drive or operate machinery until you know how this medication affects you. Sit up or stand slowly to reduce the risk of dizzy or fainting spells. Drinking alcohol with this medication can increase the risk of these side effects. Your mouth may get dry. Chewing sugarless gum or sucking hard candy and drinking plenty of water may help. Contact your care team if the problem does not go away or is severe. What side effects may I notice from receiving this medication? Side effects that you should report to your care team as soon as possible: Allergic reactions--skin rash, itching, hives, swelling of the face, lips, tongue, or throat Bleeding--bloody or black, tar-like  stools, red or dark brown urine, vomiting blood or brown material that looks like coffee grounds, small, red or purple spots on skin, unusual bleeding or bruising Heart rhythm changes--fast or irregular heartbeat, dizziness, feeling faint or lightheaded, chest pain, trouble breathing Increase in blood pressure Loss of appetite with weight loss Low sodium level--muscle weakness, fatigue, dizziness, headache, confusion Serotonin syndrome--irritability, confusion, fast or irregular heartbeat, muscle stiffness, twitching muscles, sweating, high fever, seizures, chills, vomiting, diarrhea Sudden eye pain or change in vision such as blurry vision, seeing halos around lights, vision loss Thoughts of suicide or self-harm, worsening mood, feelings of depression Side effects that usually do not require medical attention (report to your care team if they continue or are bothersome): Anxiety, nervousness Change in sex drive or performance Dizziness Dry mouth Excessive sweating Nausea Tremors or shaking Trouble sleeping This list may not describe all possible side effects. Call your doctor for medical advice about side effects. You may report side effects to FDA at 1-800-FDA-1088. Where should I keep my medication? Keep out of the reach of children and pets. Store at a controlled temperature between 20 and 25 degrees C (68 degrees and 77 degrees F), in a dry place. Throw away any unused medication after the expiration date. NOTE: This sheet is a summary. It may not cover all possible information. If you have questions about this medicine, talk to your doctor, pharmacist, or health care provider.  2024 Elsevier/Gold Standard (2022-06-10 00:00:00)

## 2023-06-15 ENCOUNTER — Other Ambulatory Visit: Payer: Self-pay | Admitting: Psychiatry

## 2023-06-15 DIAGNOSIS — F331 Major depressive disorder, recurrent, moderate: Secondary | ICD-10-CM

## 2023-06-15 DIAGNOSIS — F411 Generalized anxiety disorder: Secondary | ICD-10-CM

## 2023-06-27 ENCOUNTER — Other Ambulatory Visit: Payer: Self-pay | Admitting: Medical Genetics

## 2023-06-28 ENCOUNTER — Other Ambulatory Visit
Admission: RE | Admit: 2023-06-28 | Discharge: 2023-06-28 | Disposition: A | Discharge status: Home / Self Care | Payer: Self-pay | Source: Ambulatory Visit | Attending: Medical Genetics | Admitting: Medical Genetics

## 2023-07-11 LAB — GENECONNECT MOLECULAR SCREEN: Genetic Analysis Overall Interpretation: NEGATIVE

## 2023-07-13 ENCOUNTER — Ambulatory Visit: Payer: BC Managed Care – PPO | Admitting: Psychiatry

## 2023-08-25 ENCOUNTER — Encounter: Payer: Self-pay | Admitting: Psychiatry

## 2023-08-25 ENCOUNTER — Ambulatory Visit (INDEPENDENT_AMBULATORY_CARE_PROVIDER_SITE_OTHER): Payer: BC Managed Care – PPO | Admitting: Psychiatry

## 2023-08-25 VITALS — BP 118/82 | HR 100 | Temp 98.2°F | Ht 63.0 in | Wt 168.2 lb

## 2023-08-25 DIAGNOSIS — F411 Generalized anxiety disorder: Secondary | ICD-10-CM

## 2023-08-25 DIAGNOSIS — F331 Major depressive disorder, recurrent, moderate: Secondary | ICD-10-CM | POA: Diagnosis not present

## 2023-08-25 MED ORDER — CLONAZEPAM 0.5 MG PO TABS: 0.2500 mg | ORAL_TABLET | Freq: Every day | ORAL | 0 refills | Status: DC | PRN

## 2023-08-25 MED ORDER — VENLAFAXINE HCL ER 75 MG PO CP24: 75.0000 mg | ORAL_CAPSULE | Freq: Every day | ORAL | 0 refills | Status: DC

## 2023-08-25 NOTE — Progress Notes (Signed)
 BH MD OP Progress Note  08/25/2023 11:39 AM SAPPHIRE TYGART  MRN:  [id number]  Chief Complaint:  Chief Complaint  Patient presents with   Follow-up   Depression   Anxiety   Medication Refill   Insomnia   HPI: Jasmin Henry is a 43 year old Caucasian female, married, employed, lives in [place], has a history of MDD, GAD, vitamin B12 deficiency, attention and focus deficit, sleep problem was evaluated in office today for a follow-up appointment.  Experiencing significant stress and anxiety due to a workplace investigation, she has been dealing with challenging situations at work, including addressing performance issues with an employee who subsequently filed a complaint against her for creating a hostile environment. This has led to an ongoing investigation by an outside firm, which has been overwhelming as she is unaware of the specific allegations. The situation began in November and December when she addressed three incidents, and she was notified of the complaint in early January. The investigation is expected to take longer than the average 60 days, adding to her stress.  She has a history of major depression and generalized anxiety disorder, for which she has been taking venlafaxine 37.5 mg since November. She takes her medication regularly.  Denies active suicidal thoughts, but she describes feeling overwhelmed and having a desire to 'curl up and not get out of bed.' No engagement in self-harm or substance abuse.  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 She has been experiencing difficulty sleeping and has tried melatonin without success. She is cautious about using sleep medications due to potential habit formation.  She has a supportive network, including her husband, two friends, and family members, who are aware of the situation. She has been open with  her husband about her feelings and has reached out to friends for support. She has also been working through her employee assistance program to set up therapy appointments.  Visit Diagnosis:    ICD-10-CM   1. MDD (major depressive disorder), recurrent episode, moderate (HCC)  F33.1 venlafaxine XR (EFFEXOR XR) 75 MG 24 hr capsule    2. GAD (generalized anxiety disorder)  F41.1 venlafaxine XR (EFFEXOR XR) 75 MG 24 hr capsule    clonazePAM (KLONOPIN) 0.5 MG tablet      Past Psychiatric History: I have reviewed past psychiatric history from progress note on 05/24/2023.  Past trials of medications like Wellbutrin.  Past Medical History:  Past Medical History:  Diagnosis Date   Anxiety    Asthma    SEASONAL ALLERGIES TRIGGER ASTHMA-NO INHALERS   Back pain    Complication of anesthesia    Depression    NO MEDS   PONV (postoperative nausea and vomiting)    AFTER C-SECTION    Past Surgical History:  Procedure Laterality Date   CESAREAN SECTION  DEC 2008   LABIOPLASTY Bilateral 11/28/2015   Procedure: LABIAPLASTY;  Surgeon: Nadara Mustard, MD;  Location: [facility] ORS;  Service: Gynecology;  Laterality: Bilateral;   WISDOM TOOTH EXTRACTION      Family Psychiatric History: I have reviewed family psychiatric history from progress note on 05/24/2023.  Family History:  Family History  Problem Relation Age of Onset   Hyperlipidemia Mother    Depression Mother    Hypertension Mother    Hyperlipidemia Father    Hypertension Father    Depression Sister    Alcohol abuse Maternal Grandfather  Depression Maternal Grandfather    Hypertension Maternal Grandfather    Cancer Paternal Grandfather    Hyperlipidemia Paternal Grandmother    Birth defects Paternal Grandmother    Diabetes Paternal Grandmother    Hypertension Paternal Grandmother    Breast cancer Neg Hx     Social History: I have reviewed social history from my progress note on 05/24/2023. Social History   Socioeconomic  History   Marital status: Married    Spouse name: Not on file   Number of children: 2   Years of education: Not on file   Highest education level: Master's degree (e.g., MA, MS, MEng, MEd, MSW, MBA)  Occupational History   Not on file  Tobacco Use   Smoking status: Never   Smokeless tobacco: Never  Vaping Use   Vaping status: Never Used  Substance and Sexual Activity   Alcohol use: Yes    Comment: 6 times a week   Drug use: No   Sexual activity: Yes    Birth control/protection: I.U.D.  Other Topics Concern   Not on file  Social History Narrative   Not on file   Social Drivers of Health   Financial Resource Strain: Not on file  Food Insecurity: Not on file  Transportation Needs: Not on file  Physical Activity: Not on file  Stress: Not on file  Social Connections: Not on file    Allergies:  Allergies  Allergen Reactions   Sulfa Antibiotics Nausea And Vomiting    Metabolic Disorder Labs: Lab Results  Component Value Date   HGBA1C 5.3 05/07/2021   No results found for: "PROLACTIN" Lab Results  Component Value Date   CHOL 231 (H) 05/07/2021   TRIG 94 05/07/2021   HDL 74 05/07/2021   CHOLHDL 3.1 05/07/2021   LDLCALC 141 (H) 05/07/2021   LDLCALC 99 08/23/2019   Lab Results  Component Value Date   TSH 2.040 03/31/2023   TSH 1.360 04/06/2022    Therapeutic Level Labs: No results found for: "LITHIUM" No results found for: "VALPROATE" No results found for: "CBMZ"  Current Medications: Current Outpatient Medications  Medication Sig Dispense Refill   Adapalene (DIFFERIN) 0.3 % gel Apply 1 application topically at bedtime. qhs to face and back for acne 45 g 11   amphetamine-dextroamphetamine (ADDERALL) 10 MG tablet Take 30 mg by mouth daily with breakfast.     cholecalciferol (VITAMIN D) 1000 units tablet Take 1,000 Units by mouth daily. Take 4 gummies daily     clonazePAM (KLONOPIN) 0.5 MG tablet Take 0.5-1 tablets (0.25-0.5 mg total) by mouth daily as  needed for up to 5 days for anxiety. Short supply , please limit use 5 tablet 0   Dapsone 5 % topical gel Apply 1 application topically in the morning. qam to face and back for acne (Patient taking differently: Apply 1 application  topically as needed. qam to face and back for acne) 60 g 11   fexofenadine (ALLEGRA) 180 MG tablet Take 180 mg by mouth daily as needed.     Fluocinolone Acetonide Body 0.01 % OIL Apply 1 application  topically in the morning and at bedtime. 120 mL 2   fluticasone (FLONASE) 50 MCG/ACT nasal spray Place 2 sprays into both nostrils daily.     levonorgestrel (MIRENA) 20 MCG/24HR IUD 1 each by Intrauterine route once.     TRI-LO-MARZIA 0.18/0.215/0.25 MG-25 MCG TABS once.     venlafaxine XR (EFFEXOR XR) 75 MG 24 hr capsule Take 1 capsule (75 mg total) by  mouth daily with breakfast. 90 capsule 0   No current facility-administered medications for this visit.     Musculoskeletal: Strength & Muscle Tone: within normal limits Gait & Station: normal Patient leans: N/A  Psychiatric Specialty Exam: Review of Systems  Psychiatric/Behavioral:  Positive for decreased concentration, dysphoric mood and sleep disturbance. The patient is nervous/anxious.     Blood pressure 118/82, pulse 100, temperature 98.2 F (36.8 C), temperature source Temporal, height 5\' 3"  (1.6 m), weight 168 lb 3.2 oz (76.3 kg), SpO2 99%.Body mass index is 29.8 kg/m.  General Appearance: Casual  Eye Contact:  Fair  Speech:  Clear and Coherent  Volume:  Normal  Mood:  Anxious and Depressed  Affect:  Tearful  Thought Process:  Goal Directed and Descriptions of Associations: Intact  Orientation:  Full (Time, Place, and Person)  Thought Content: Logical   Suicidal Thoughts:  No  Homicidal Thoughts:  No  Memory:  Immediate;   Fair Recent;   Fair Remote;   Fair  Judgement:  Fair  Insight:  Fair  Psychomotor Activity:  Normal  Concentration:  Concentration: Fair and Attention Span: Fair  Recall:   [employer] of Knowledge: Fair  Language: Fair  Akathisia:  No  Handed:  Right  AIMS (if indicated): not done  Assets:  Communication Skills Desire for Improvement Housing Social Support Transportation  ADL's:  Intact  Cognition: WNL  Sleep:  Poor   Screenings: GAD-7    Loss adjuster, chartered Office Visit from 08/25/2023 in [facility] Office Visit from 05/24/2023 in [facility]  Total GAD-7 Score 12 19      PHQ2-9    Flowsheet Row Office Visit from 08/25/2023 in [facility] Office Visit from 05/24/2023 in [facility]  PHQ-2 Total Score 3 1  PHQ-9 Total Score 15 12      Flowsheet Row Office Visit from 05/24/2023 in [facility]  C-SSRS RISK CATEGORY Moderate Risk        Assessment and Plan: CHARLA CRISCIONE is a 43 year old Caucasian female, employed, 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  Major Depressive Disorder -unstable Experiencing exacerbation of symptoms due to work-related stress and an ongoing HR investigation. Symptoms include feelings of being overwhelmed, crying at home. Currently on venlafaxine 37.5 mg since November. Discussed increasing venlafaxine to 75 mg daily, potential side effects (nausea, dizziness), benefits of therapy, and the option of an intensive outpatient program (IOP) if symptoms worsen. Concerns about the cost of frequent appointments were expressed.   - Increase Venlafaxine to 75 mg daily.   - Start therapy through the employee assistance program.   - Consider IOP if symptoms worsen.  Patient agrees to let this provider know. - Provide Clonazepam for acute anxiety management, with instructions to use sparingly.   - Discussed sleep  medication initiation however patient is not interested.  Advised to get Melatonin combination medications over-the-counter.  Generalized Anxiety Disorder - unstable Symptoms exacerbated by work-related stress and an ongoing HR investigation. Symptoms include significant anxiety, difficulty sleeping, and feelings of being overwhelmed. Discussed non-pharmacological options for sleep (melatonin with magnesium, ashwagandha) and risks of benzodiazepines (dependency, sedation).   - Continue Venlafaxine, now increased to 75 mg daily.   - Provide Clonazepam 0.25-0.5 mg as needed for up to 5 days for acute anxiety  management. - Reviewed [place] PMP AWARxE - Consider  options for sleep (melatonin with magnesium, ashwagandha).    Rule out attention-Deficit/Hyperactivity Disorder (ADHD)   Prescribed Adderall 10 mg by an online provider that she had telehealth visit with recently but has not started. Awaiting formal ADHD testing, referred in November but not yet scheduled. Discussed importance of formal testing for accurate diagnosis and treatment planning.   - Follow up on ADHD testing referral with Dr. Marvetta Gibbons office.   - Consider referral to [facility] in [place] if unable to schedule with Dr. Tamsen Snider.        Follow-up   - Schedule follow-up visit in one month.   - Contact the office if symptoms worsen or if she decides to pursue IOP.   Collaboration of Care: Collaboration of Care: Referral or follow-up with counselor/therapist AEB patient encouraged to continue CBT she has upcoming appointment with employee assistance.  Also discussed referral for IOP patient will let this provider know.  Patient/Guardian was advised Release of Information must be obtained prior to any record release in order to collaborate their care with an outside provider. Patient/Guardian was advised if they have not already done so to contact the registration department to sign all necessary forms in order for [place]  to release information regarding their care.   Consent: Patient/Guardian gives verbal consent for treatment and assignment of benefits for services provided during this visit. Patient/Guardian expressed understanding and agreed to proceed.  Discussed the use of a AI scribe software for clinical note transcription with the patient, who gave verbal consent to proceed.  This note was generated in part or whole with voice recognition software. Voice recognition is usually quite accurate but there are transcription errors that can and very often do occur. I apologize for any typographical errors that were not detected and corrected.     Jomarie Longs, MD 08/25/2023, 11:39 AM

## 2023-08-25 NOTE — Patient Instructions (Signed)
 Clonazepam Tablets What is this medication? CLONAZEPAM (kloe NA ze pam) treats seizures. It may also be used to treat panic disorder. It works by Education administrator system calm down. It belongs to a group of medications called benzodiazepines. This medicine may be used for other purposes; ask your health care provider or pharmacist if you have questions. COMMON BRAND NAME(S): Ceberclon, Klonopin What should I tell my care team before I take this medication? They need to know if you have any of these conditions: Bipolar disorder, depression, psychosis, or other mental health conditions Glaucoma Kidney disease Liver disease Lung or breathing disease Myasthenia gravis Parkinson disease Porphyria Seizures or a history of seizures Substance use disorder Suicidal thoughts, plans, or attempt by you or a family member An unusual or allergic reaction to clonazepam, other medications, foods, dyes, or preservatives Pregnant or trying to get pregnant Breastfeeding How should I use this medication? Take this medication by mouth with water. Take it as directed on the prescription label at the same time every day. You can take it with or without food. If it upsets your stomach, take it with food. Do not take it more often than directed. Do not stop taking or change the dose except on the advice of your care team. A special MedGuide will be given to you by the pharmacist with each prescription and refill. Be sure to read this information carefully each time. Talk to your care team about the use of this medication in children. Special care may be needed. Overdosage: If you think you have taken too much of this medicine contact a poison control center or emergency room at once. NOTE: This medicine is only for you. Do not share this medicine with others. What if I miss a dose? If you miss a dose, take it as soon as you can. If it is almost time for your next dose, take only that dose. Do not take double  or extra doses. What may interact with this medication? Do not take this medication with any of the following: Opioid medications for cough Sodium oxybate This medication may also interact with the following: Alcohol Antihistamines for allergy, cough, and cold Antiviral medications for HIV or AIDS Certain medications for anxiety or sleep Certain medications for depression, such as amitriptyline, fluoxetine, sertraline Certain medications for fungal infections, such as ketoconazole and itraconazole Certain medications for seizures, such as carbamazepine, phenobarbital, phenytoin, primidone General anesthetics, such as halothane, isoflurane, methoxyflurane, propofol Local anesthetics, such as lidocaine, pramoxine, tetracaine Medications that relax muscles for surgery Opioid medications for pain Phenothiazines, such as chlorpromazine, mesoridazine, prochlorperazine, thioridazine This list may not describe all possible interactions. Give your health care provider a list of all the medicines, herbs, non-prescription drugs, or dietary supplements you use. Also tell them if you smoke, drink alcohol, or use illegal drugs. Some items may interact with your medicine. What should I watch for while using this medication? Visit your care team for regular checks on your progress. Tell your care team if your symptoms do not start to get better or if they get worse. Do not suddenly stop taking this medication. You may develop a severe reaction. Your care team will tell you how much medication to take. If your care team wants you to stop the medication, the dose may be slowly lowered over time to avoid any side effects. This medication may affect your coordination, reaction time, or judgment. Do not drive or operate machinery until you know how this medication affects you.  Sit up or stand slowly to reduce the risk of dizzy or fainting spells. Drinking alcohol with this medication can increase the risk of these  side effects. If you are taking another medication that also causes drowsiness, you may have more side effects. Give your care team a list of all medications you use. Your care team will tell you how much medication to take. Do not take more medication than directed. Get emergency help right away if you have problems breathing or unusual sleepiness. If you or your family notice any changes in your behavior, such as new or worsening depression, thoughts of harming yourself, anxiety, other unusual or disturbing thoughts, or memory loss, call your care team right away. Talk to your care team if you wish to become pregnant or think you might be pregnant. This medication can cause serious birth defects if taken during pregnancy. Talk to your care team before breastfeeding. Changes to your treatment plan may be needed. What side effects may I notice from receiving this medication? Side effects that you should report to your care team as soon as possible: Allergic reactions--skin rash, itching, hives, swelling of the face, lips, tongue, or throat CNS depression--slow or shallow breathing, shortness of breath, feeling faint, dizziness, confusion, trouble staying awake Thoughts of suicide or self-harm, worsening mood, feelings of depression Side effects that usually do not require medical attention (report to your care team if they continue or are bothersome): Dizziness Drowsiness Headache This list may not describe all possible side effects. Call your doctor for medical advice about side effects. You may report side effects to FDA at 1-800-FDA-1088. Where should I keep my medication? Keep out of the reach of children and pets. This medication can be abused. Keep your medication in a safe place to protect it from theft. Do not share this medication with anyone. Selling or giving away this medication is dangerous and against the law. Store at room temperature between 15 and 30 degrees C (59 and 86 degrees F).  Protect from light. Keep container tightly closed. This medication may cause accidental overdose and death if taken by other adults, children, or pets. Mix any unused medication with a substance like cat litter or coffee grounds. Then throw the medication away in a sealed container like a sealed bag or a coffee can with a lid. Do not use the medication after the expiration date. NOTE: This sheet is a summary. It may not cover all possible information. If you have questions about this medicine, talk to your doctor, pharmacist, or health care provider.  2024 Elsevier/Gold Standard (2022-03-10 00:00:00)

## 2023-09-11 ENCOUNTER — Other Ambulatory Visit: Payer: Self-pay | Admitting: Psychiatry

## 2023-09-11 DIAGNOSIS — F331 Major depressive disorder, recurrent, moderate: Secondary | ICD-10-CM

## 2023-09-11 DIAGNOSIS — F411 Generalized anxiety disorder: Secondary | ICD-10-CM

## 2023-09-14 ENCOUNTER — Ambulatory Visit: Payer: Self-pay | Admitting: Adult Health

## 2023-09-14 ENCOUNTER — Other Ambulatory Visit: Payer: Self-pay

## 2023-09-14 ENCOUNTER — Encounter: Payer: Self-pay | Admitting: Adult Health

## 2023-09-14 VITALS — BP 147/82 | HR 80 | Temp 98.3°F | Ht 63.0 in | Wt 168.0 lb

## 2023-09-14 DIAGNOSIS — J0110 Acute frontal sinusitis, unspecified: Secondary | ICD-10-CM

## 2023-09-14 DIAGNOSIS — J029 Acute pharyngitis, unspecified: Secondary | ICD-10-CM

## 2023-09-14 DIAGNOSIS — R051 Acute cough: Secondary | ICD-10-CM

## 2023-09-14 LAB — POC SOFIA 2 FLU + SARS ANTIGEN FIA
Influenza A, POC: NEGATIVE
Influenza B, POC: NEGATIVE
SARS Coronavirus 2 Ag: NEGATIVE

## 2023-09-14 MED ORDER — AMOXICILLIN-POT CLAVULANATE 875-125 MG PO TABS: 1.0000 | ORAL_TABLET | Freq: Two times a day (BID) | ORAL | 0 refills | Status: DC

## 2023-09-14 MED ORDER — PSEUDOEPH-BROMPHEN-DM 30-2-10 MG/5ML PO SYRP: 5.0000 mL | ORAL_SOLUTION | Freq: Every evening | ORAL | 0 refills | Status: AC | PRN

## 2023-09-14 NOTE — Progress Notes (Signed)
 [facility] [address] [place], [place] [address]   Office Visit Note  Patient Name: Jasmin Henry Date of Birth [id number]  Medical Record number [id number]  Date of Service: 09/14/2023  Chief Complaint  Patient presents with   Sick visit    Patient c/o cough with green sputum production, nasal congestion with brown mucus, and sore throat. Denies fever. Symptoms began yesterday.      HPI Pt is here for a sick visit. She reports yesterday she started having congestion, sore throat, coughing, mucous, and some body aches and ear pressure.  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    Current Medication:  Outpatient Encounter Medications as of 09/14/2023  Medication Sig   Adapalene (DIFFERIN) 0.3 % gel Apply 1 application topically at bedtime. qhs to face and back for acne   amoxicillin-clavulanate (AUGMENTIN) 875-125 MG tablet Take 1 tablet by mouth 2 (two) times daily.   amphetamine-dextroamphetamine (ADDERALL) 10 MG tablet Take 30 mg by mouth daily with breakfast.   cholecalciferol (VITAMIN D) 1000 units tablet Take 1,000 Units by mouth daily. Take 4 gummies daily   clonazePAM (KLONOPIN) 0.5 MG tablet Take 0.5-1 tablets (0.25-0.5 mg total) by mouth daily as needed for up to 5 days for anxiety. Short supply , please limit use   Dapsone 5 % topical gel Apply 1 application topically in the morning. qam to face and back for acne (Patient taking differently: Apply 1 application  topically as needed. qam to face and back for acne)   fexofenadine (ALLEGRA) 180 MG tablet Take 180 mg by mouth daily as needed.   Fluocinolone Acetonide Body 0.01 % OIL Apply 1 application  topically in the morning and at bedtime.   fluticasone (FLONASE) 50 MCG/ACT nasal spray Place 2 sprays into both nostrils daily.   levonorgestrel (MIRENA) 20 MCG/24HR IUD 1 each by Intrauterine route once.   venlafaxine XR (EFFEXOR XR)  75 MG 24 hr capsule Take 1 capsule (75 mg total) by mouth daily with breakfast.   [DISCONTINUED] TRI-LO-MARZIA 0.18/0.215/0.25 MG-25 MCG TABS once.   No facility-administered encounter medications on file as of 09/14/2023.      Medical History: Past Medical History:  Diagnosis Date   Anxiety    Asthma    SEASONAL ALLERGIES TRIGGER ASTHMA-NO INHALERS   Back pain    Complication of anesthesia    Depression    NO MEDS   PONV (postoperative nausea and vomiting)    AFTER C-SECTION     Vital Signs: BP (!) 147/82   Pulse 80   Temp 98.3 F (36.8 C)   Ht 5\' 3"  (1.6 m)   Wt 168 lb (76.2 kg)   SpO2 99%   BMI 29.76 kg/m    Review of Systems  Constitutional:  Negative for chills, fatigue and fever.  HENT:  Positive for congestion, ear pain and sore throat.   Eyes:  Negative for pain and redness.  Respiratory:  Positive for cough.   Cardiovascular:  Negative for chest pain.  Gastrointestinal:  Negative for diarrhea, nausea and vomiting.  Musculoskeletal:  Positive for myalgias.    Physical Exam Vitals reviewed.  Constitutional:      Appearance: Normal appearance.  HENT:     Head: Normocephalic.     Right Ear: Tympanic membrane and ear canal normal.     Left Ear: Tympanic membrane and ear canal normal.  Nose:     Right Sinus: Frontal sinus tenderness present. No maxillary sinus tenderness.     Left Sinus: Frontal sinus tenderness present. No maxillary sinus tenderness.  Lymphadenopathy:     Cervical: No cervical adenopathy.      Results for orders placed or performed in visit on 09/14/23 (from the past 24 hours)  POC SOFIA 2 FLU + SARS ANTIGEN FIA     Status: None   Collection Time: 09/14/23  9:53 AM  Result Value Ref Range   Influenza A, POC Negative Negative   Influenza B, POC Negative Negative   SARS Coronavirus 2 Ag Negative Negative    Assessment/Plan: 1. Acute non-recurrent frontal sinusitis (Primary) Patient Instructions: -Take complete course of  antibiotics as prescribed.  Take with food.  -Try Flonase/Fluticasone nasal spray, 2 sprays to each nostril once a day. -You can try using a neti pot or nasal saline rinse product to help clear mucus congestion. -Rest and stay well hydrated (by drinking water and other liquids). Avoid/limit caffeine. -Take over-the-counter medicines (i.e. Mucinex, decongestant, Ibuprofen or Tylenol, cough suppressant) to help relieve your symptoms. -For your cough, use cough drops/throat lozenges, gargle warm salt water and/or drink warm liquids (like tea with honey). -Send my chart message to provider or schedule return visit as needed for new/worsening symptoms or if symptoms do not improve as discussed with antibiotic and other recommended treatment.   - amoxicillin-clavulanate (AUGMENTIN) 875-125 MG tablet; Take 1 tablet by mouth 2 (two) times daily.  Dispense: 20 tablet; Refill: 0  2. Acute cough - POC SOFIA 2 FLU + SARS ANTIGEN FIA - brompheniramine-pseudoephedrine-DM 30-2-10 MG/5ML syrup; Take 5 mLs by mouth at bedtime as needed.  Dispense: 60 mL; Refill: 0  3. Sore throat - POC SOFIA 2 FLU + SARS ANTIGEN FIA        General Counseling: [name] verbalizes understanding of the findings of todays visit and agrees with plan of treatment. I have discussed any further diagnostic evaluation that may be needed or ordered today. We also reviewed her medications today. she has been encouraged to call the office with any questions or concerns that should arise related to todays visit.   Orders Placed This Encounter  Procedures   POC SOFIA 2 FLU + SARS ANTIGEN FIA    Meds ordered this encounter  Medications   amoxicillin-clavulanate (AUGMENTIN) 875-125 MG tablet    Sig: Take 1 tablet by mouth 2 (two) times daily.    Dispense:  20 tablet    Refill:  0    Time spent:15 Minutes    Johnna Acosta AGNP-C Nurse Practitioner

## 2023-10-27 ENCOUNTER — Ambulatory Visit: Payer: BC Managed Care – PPO | Admitting: Psychiatry

## 2023-11-19 ENCOUNTER — Other Ambulatory Visit: Payer: Self-pay | Admitting: Psychiatry

## 2023-11-19 DIAGNOSIS — F331 Major depressive disorder, recurrent, moderate: Secondary | ICD-10-CM

## 2023-11-19 DIAGNOSIS — F411 Generalized anxiety disorder: Secondary | ICD-10-CM

## 2023-12-19 ENCOUNTER — Telehealth: Payer: Self-pay | Admitting: Psychiatry

## 2023-12-19 NOTE — Telephone Encounter (Signed)
 Noted

## 2023-12-19 NOTE — Telephone Encounter (Signed)
 Patient left phone message to cancel her appointment. Stating she has found a provider to conduct the ADHD testing and to manage her medication. She wanted it noted in her chart that she has moved care to another facility and to cancel the appointment for 12-20-23

## 2023-12-20 ENCOUNTER — Ambulatory Visit: Admitting: Psychiatry

## 2024-01-03 ENCOUNTER — Other Ambulatory Visit: Payer: Self-pay

## 2024-01-03 ENCOUNTER — Encounter: Payer: Self-pay | Admitting: Physician Assistant

## 2024-01-03 ENCOUNTER — Ambulatory Visit (INDEPENDENT_AMBULATORY_CARE_PROVIDER_SITE_OTHER): Payer: Self-pay | Admitting: Physician Assistant

## 2024-01-03 VITALS — BP 117/92 | HR 115 | Temp 100.1°F | Ht 63.0 in | Wt 170.0 lb

## 2024-01-03 DIAGNOSIS — R059 Cough, unspecified: Secondary | ICD-10-CM

## 2024-01-03 DIAGNOSIS — R509 Fever, unspecified: Secondary | ICD-10-CM

## 2024-01-03 LAB — POC SOFIA 2 FLU + SARS ANTIGEN FIA
Influenza A, POC: NEGATIVE
Influenza B, POC: NEGATIVE
SARS Coronavirus 2 Ag: POSITIVE — AB

## 2024-01-03 NOTE — Progress Notes (Signed)
 [facility] [address] [place], [place] [address]   Office Visit Note  Patient Name: Jasmin Henry Date of Birth [id number]  Medical Record number [id number]  Date of Service: 01/03/2024  Chief Complaint  Patient presents with   Acute Visit    Patient c/o nasal congestion, productive cough, and feeling soreness in her joints and neck. She also felt like she was running a fever last night but does not have a thermometer at home to check her temperature. Symptoms began yesterday morning. She took 3 ibuprofen  and Sudafed this morning and also used her Netipot.     HPI Patient reports nasal congestion, productive cough, and soreness in her joints and neck that began yesterday morning. She felt like she was running a fever last night but does not have a thermometer to confirm. This morning, she took three doses of ibuprofen , used Sudafed, and performed nasal irrigation with a Neti pot.   Current Medication:  Outpatient Encounter Medications as of 01/03/2024  Medication Sig   Adapalene  (DIFFERIN ) 0.3 % gel Apply 1 application topically at bedtime. qhs to face and back for acne   amphetamine-dextroamphetamine (ADDERALL) 10 MG tablet Take 30 mg by mouth daily with breakfast.   cholecalciferol (VITAMIN D ) 1000 units tablet Take 1,000 Units by mouth daily. Take 4 gummies daily   fexofenadine (ALLEGRA) 180 MG tablet Take 180 mg by mouth daily as needed.   fluticasone (FLONASE) 50 MCG/ACT nasal spray Place 2 sprays into both nostrils daily. (Patient taking differently: Place 2 sprays into both nostrils daily as needed.)   levonorgestrel (MIRENA) 20 MCG/24HR IUD 1 each by Intrauterine route once.   brompheniramine-pseudoephedrine-DM 30-2-10 MG/5ML syrup Take 5 mLs by mouth at bedtime as needed. (Patient not taking: Reported on 01/03/2024)   Dapsone  5 % topical gel Apply 1 application topically in the morning. qam to face and back for acne (Patient taking differently: Apply 1 application   topically as needed. qam to face and back for acne)   [DISCONTINUED] amoxicillin -clavulanate (AUGMENTIN ) 875-125 MG tablet Take 1 tablet by mouth 2 (two) times daily.   [DISCONTINUED] clonazePAM  (KLONOPIN ) 0.5 MG tablet Take 0.5-1 tablets (0.25-0.5 mg total) by mouth daily as needed for up to 5 days for anxiety. Short supply , please limit use   [DISCONTINUED] Fluocinolone  Acetonide Body 0.01 % OIL Apply 1 application  topically in the morning and at bedtime.   [DISCONTINUED] venlafaxine  XR (EFFEXOR -XR) 75 MG 24 hr capsule TAKE 1 CAPSULE BY MOUTH DAILY WITH BREAKFAST.   No facility-administered encounter medications on file as of 01/03/2024.      Medical History: Past Medical History:  Diagnosis Date   Anxiety    Asthma    SEASONAL ALLERGIES TRIGGER ASTHMA-NO INHALERS   Back pain    Complication of anesthesia    Depression    NO MEDS   PONV (postoperative nausea and vomiting)    AFTER C-SECTION     Vital Signs: BP (!) 117/92   Pulse (!) 115   Temp 100.1 F (37.8 C)   Ht 5' 3 (1.6 m)   Wt 170 lb (77.1 kg)   SpO2 98%   BMI 30.11 kg/m    Review of Systems  Physical Exam    Assessment/Plan: Patient was seen by nursing staff and tested positive for COVID-19 in the clinic. She was educated on isolation precautions, symptom monitoring, and when to seek medical care. Advised to stay hydrated, rest, and follow public health guidelines. Encouraged to contact  the clinic if symptoms worsen or new symptoms develop.  General Counseling: Jasmin Henry verbalizes understanding of the findings of todays visit and agrees with plan of treatment. I have discussed any further diagnostic evaluation that may be needed or ordered today. We also reviewed her medications today. she has been encouraged to call the office with any questions or concerns that should arise related to todays visit.   Orders Placed This Encounter  Procedures   POC SOFIA 2 FLU + SARS ANTIGEN FIA    No orders of the  defined types were placed in this encounter.   Odis Potash, PA-C Physician Assistant

## 2024-04-18 ENCOUNTER — Other Ambulatory Visit: Payer: Self-pay | Admitting: Adult Health

## 2024-04-18 DIAGNOSIS — Z1231 Encounter for screening mammogram for malignant neoplasm of breast: Secondary | ICD-10-CM

## 2024-04-25 ENCOUNTER — Ambulatory Visit
Admission: RE | Admit: 2024-04-25 | Discharge: 2024-04-25 | Disposition: A | Discharge status: Home / Self Care | Source: Ambulatory Visit | Attending: Adult Health | Admitting: Adult Health

## 2024-04-25 DIAGNOSIS — Z1231 Encounter for screening mammogram for malignant neoplasm of breast: Secondary | ICD-10-CM

## 2024-08-02 ENCOUNTER — Encounter: Payer: Self-pay | Admitting: Adult Health

## 2024-08-02 ENCOUNTER — Other Ambulatory Visit: Payer: Self-pay | Admitting: Adult Health

## 2024-08-02 MED ORDER — AMOXICILLIN-POT CLAVULANATE 875-125 MG PO TABS: 1.0000 | ORAL_TABLET | Freq: Two times a day (BID) | ORAL | 0 refills | Status: AC
